# Patient Record
Sex: Female | Born: 1985 | Race: White | Hispanic: No | Marital: Married | Smoking: Current some day smoker
Health system: Southern US, Community
[De-identification: ages and names within clinical notes are randomized; demographics above are authoritative.]

## PROBLEM LIST (undated history)

## (undated) DIAGNOSIS — F909 Attention-deficit hyperactivity disorder, unspecified type: Secondary | ICD-10-CM

---

## 2005-08-17 ENCOUNTER — Emergency Department (HOSPITAL_COMMUNITY): Admission: EM | Admit: 2005-08-17 | Discharge: 2005-08-17 | Payer: Self-pay | Admitting: Emergency Medicine

## 2007-08-23 ENCOUNTER — Emergency Department (HOSPITAL_COMMUNITY): Admission: EM | Admit: 2007-08-23 | Discharge: 2007-08-23 | Payer: Self-pay | Admitting: Emergency Medicine

## 2011-01-23 LAB — URINE MICROSCOPIC-ADD ON

## 2011-01-23 LAB — CBC
HCT: 42.3
Hemoglobin: 14.5
MCHC: 34.4
MCV: 87.6
Platelets: 187
RBC: 4.83
RDW: 12.8
WBC: 10.5

## 2011-01-23 LAB — COMPREHENSIVE METABOLIC PANEL
Albumin: 4
BUN: 7
Creatinine, Ser: 0.88
Total Bilirubin: 0.6
Total Protein: 7.4

## 2011-01-23 LAB — DIFFERENTIAL
Basophils Absolute: 0
Lymphocytes Relative: 9 — ABNORMAL LOW
Monocytes Absolute: 0.4
Monocytes Relative: 4
Neutro Abs: 9.1 — ABNORMAL HIGH

## 2011-01-23 LAB — LIPASE, BLOOD: Lipase: 23

## 2011-01-23 LAB — URINALYSIS, ROUTINE W REFLEX MICROSCOPIC
Nitrite: NEGATIVE
Specific Gravity, Urine: 1.022
pH: 7.5

## 2019-03-05 ENCOUNTER — Other Ambulatory Visit: Payer: Self-pay

## 2019-03-05 DIAGNOSIS — Z20822 Contact with and (suspected) exposure to covid-19: Secondary | ICD-10-CM

## 2019-03-06 LAB — NOVEL CORONAVIRUS, NAA: SARS-CoV-2, NAA: NOT DETECTED

## 2019-06-19 ENCOUNTER — Observation Stay (HOSPITAL_COMMUNITY)
Admission: EM | Admit: 2019-06-19 | Discharge: 2019-06-20 | Disposition: A | Discharge status: Home / Self Care | Payer: No Typology Code available for payment source | Attending: Surgery | Admitting: Surgery

## 2019-06-19 ENCOUNTER — Emergency Department (HOSPITAL_COMMUNITY): Payer: No Typology Code available for payment source

## 2019-06-19 ENCOUNTER — Encounter (HOSPITAL_COMMUNITY)
Admission: EM | Disposition: A | Discharge status: Home / Self Care | Payer: Self-pay | Source: Home / Self Care | Attending: Emergency Medicine

## 2019-06-19 ENCOUNTER — Emergency Department (HOSPITAL_COMMUNITY): Payer: No Typology Code available for payment source | Admitting: Anesthesiology

## 2019-06-19 ENCOUNTER — Other Ambulatory Visit: Payer: Self-pay

## 2019-06-19 ENCOUNTER — Encounter (HOSPITAL_COMMUNITY): Payer: Self-pay

## 2019-06-19 DIAGNOSIS — Z20822 Contact with and (suspected) exposure to covid-19: Secondary | ICD-10-CM | POA: Diagnosis not present

## 2019-06-19 DIAGNOSIS — F909 Attention-deficit hyperactivity disorder, unspecified type: Secondary | ICD-10-CM | POA: Insufficient documentation

## 2019-06-19 DIAGNOSIS — Z79899 Other long term (current) drug therapy: Secondary | ICD-10-CM | POA: Diagnosis not present

## 2019-06-19 DIAGNOSIS — F17210 Nicotine dependence, cigarettes, uncomplicated: Secondary | ICD-10-CM | POA: Diagnosis not present

## 2019-06-19 DIAGNOSIS — K3580 Unspecified acute appendicitis: Secondary | ICD-10-CM | POA: Diagnosis not present

## 2019-06-19 HISTORY — DX: Attention-deficit hyperactivity disorder, unspecified type: F90.9

## 2019-06-19 HISTORY — PX: LAPAROSCOPIC APPENDECTOMY: SHX408

## 2019-06-19 LAB — CBC
HCT: 44 % (ref 36.0–46.0)
Hemoglobin: 15 g/dL (ref 12.0–15.0)
MCH: 30.9 pg (ref 26.0–34.0)
MCHC: 34.1 g/dL (ref 30.0–36.0)
MCV: 90.5 fL (ref 80.0–100.0)
Platelets: 200 10*3/uL (ref 150–400)
RBC: 4.86 MIL/uL (ref 3.87–5.11)
RDW: 12.6 % (ref 11.5–15.5)
WBC: 11.3 10*3/uL — ABNORMAL HIGH (ref 4.0–10.5)
nRBC: 0 % (ref 0.0–0.2)

## 2019-06-19 LAB — I-STAT BETA HCG BLOOD, ED (MC, WL, AP ONLY): I-stat hCG, quantitative: <5 m[IU]/mL (ref <5)

## 2019-06-19 LAB — COMPREHENSIVE METABOLIC PANEL
ALT: 18 U/L (ref 0–44)
AST: 20 U/L (ref 15–41)
Albumin: 4.5 g/dL (ref 3.5–5.0)
Alkaline Phosphatase: 50 U/L (ref 38–126)
Anion gap: 9 (ref 5–15)
BUN: 8 mg/dL (ref 6–20)
CO2: 26 mmol/L (ref 22–32)
Calcium: 9.5 mg/dL (ref 8.9–10.3)
Chloride: 103 mmol/L (ref 98–111)
Creatinine, Ser: 0.83 mg/dL (ref 0.44–1.00)
GFR calc Af Amer: >60 mL/min (ref >60)
GFR calc non Af Amer: >60 mL/min (ref >60)
Glucose, Bld: 102 mg/dL — ABNORMAL HIGH (ref 70–99)
Potassium: 3.6 mmol/L (ref 3.5–5.1)
Sodium: 138 mmol/L (ref 135–145)
Total Bilirubin: 1 mg/dL (ref 0.3–1.2)
Total Protein: 7.9 g/dL (ref 6.5–8.1)

## 2019-06-19 LAB — URINALYSIS, ROUTINE W REFLEX MICROSCOPIC
Bilirubin Urine: NEGATIVE
Glucose, UA: NEGATIVE mg/dL
Hgb urine dipstick: NEGATIVE
Ketones, ur: NEGATIVE mg/dL
Leukocytes,Ua: NEGATIVE
Nitrite: NEGATIVE
Protein, ur: NEGATIVE mg/dL
Specific Gravity, Urine: 1.017 (ref 1.005–1.030)
pH: 8 (ref 5.0–8.0)

## 2019-06-19 LAB — RESPIRATORY PANEL BY RT PCR (FLU A&B, COVID)
Influenza A by PCR: NEGATIVE
Influenza B by PCR: NEGATIVE
SARS Coronavirus 2 by RT PCR: NEGATIVE

## 2019-06-19 LAB — LIPASE, BLOOD: Lipase: 23 U/L (ref 11–51)

## 2019-06-19 SURGERY — APPENDECTOMY, LAPAROSCOPIC
Anesthesia: General | Site: Abdomen

## 2019-06-19 MED ORDER — METHOCARBAMOL 1000 MG/10ML IJ SOLN
500.0000 mg | Freq: Three times a day (TID) | INTRAVENOUS | Status: DC | PRN
  Filled 2019-06-19: qty 5

## 2019-06-19 MED ORDER — LACTATED RINGERS IV SOLN
INTRAVENOUS | Status: AC | PRN
  Administered 2019-06-19: 1000 mL via INTRAVENOUS

## 2019-06-19 MED ORDER — ENOXAPARIN SODIUM 40 MG/0.4ML ~~LOC~~ SOLN
40.0000 mg | SUBCUTANEOUS | Status: DC
  Administered 2019-06-20: 40 mg via SUBCUTANEOUS
  Filled 2019-06-19: qty 0.4

## 2019-06-19 MED ORDER — HYDROMORPHONE HCL 1 MG/ML IJ SOLN
0.2500 mg | INTRAMUSCULAR | Status: DC | PRN
  Administered 2019-06-19: 0.25 mg via INTRAVENOUS

## 2019-06-19 MED ORDER — SODIUM CHLORIDE 0.9 % IV SOLN: INTRAVENOUS | Status: DC

## 2019-06-19 MED ORDER — HYDROMORPHONE HCL 1 MG/ML IJ SOLN: 0.5000 mg | INTRAMUSCULAR | Status: DC | PRN

## 2019-06-19 MED ORDER — ROCURONIUM BROMIDE 10 MG/ML (PF) SYRINGE
PREFILLED_SYRINGE | INTRAVENOUS | Status: DC | PRN
  Administered 2019-06-19: 10 mg via INTRAVENOUS
  Administered 2019-06-19: 30 mg via INTRAVENOUS

## 2019-06-19 MED ORDER — HYDROMORPHONE HCL 1 MG/ML IJ SOLN
INTRAMUSCULAR | Status: AC
  Filled 2019-06-19: qty 1

## 2019-06-19 MED ORDER — BUPIVACAINE-EPINEPHRINE 0.5% -1:200000 IJ SOLN
INTRAMUSCULAR | Status: DC | PRN
  Administered 2019-06-19: 35 mL

## 2019-06-19 MED ORDER — DIPHENHYDRAMINE HCL 25 MG PO CAPS: 25.0000 mg | ORAL_CAPSULE | Freq: Four times a day (QID) | ORAL | Status: DC | PRN

## 2019-06-19 MED ORDER — LIDOCAINE 2% (20 MG/ML) 5 ML SYRINGE
INTRAMUSCULAR | Status: AC
  Filled 2019-06-19: qty 5

## 2019-06-19 MED ORDER — PIPERACILLIN-TAZOBACTAM 3.375 G IVPB 30 MIN
3.3750 g | Freq: Once | INTRAVENOUS | Status: AC
  Administered 2019-06-19: 13:00:00 3.375 g via INTRAVENOUS
  Filled 2019-06-19: qty 50

## 2019-06-19 MED ORDER — ACETAMINOPHEN 325 MG PO TABS: 650.0000 mg | ORAL_TABLET | Freq: Four times a day (QID) | ORAL | Status: DC | PRN

## 2019-06-19 MED ORDER — ACETAMINOPHEN 500 MG PO TABS
1000.0000 mg | ORAL_TABLET | ORAL | Status: AC
  Administered 2019-06-19: 1000 mg via ORAL
  Filled 2019-06-19: qty 2

## 2019-06-19 MED ORDER — SODIUM CHLORIDE 0.9% FLUSH: 3.0000 mL | Freq: Once | INTRAVENOUS | Status: DC

## 2019-06-19 MED ORDER — ONDANSETRON 4 MG PO TBDP
4.0000 mg | ORAL_TABLET | Freq: Four times a day (QID) | ORAL | Status: DC | PRN
  Filled 2019-06-19: qty 1

## 2019-06-19 MED ORDER — PHENYLEPHRINE 40 MCG/ML (10ML) SYRINGE FOR IV PUSH (FOR BLOOD PRESSURE SUPPORT)
PREFILLED_SYRINGE | INTRAVENOUS | Status: AC
  Filled 2019-06-19: qty 10

## 2019-06-19 MED ORDER — TRAMADOL HCL 50 MG PO TABS: 50.0000 mg | ORAL_TABLET | Freq: Four times a day (QID) | ORAL | Status: DC | PRN

## 2019-06-19 MED ORDER — MIDAZOLAM HCL 2 MG/2ML IJ SOLN
INTRAMUSCULAR | Status: AC
  Filled 2019-06-19: qty 2

## 2019-06-19 MED ORDER — DIPHENHYDRAMINE HCL 50 MG/ML IJ SOLN: 25.0000 mg | Freq: Four times a day (QID) | INTRAMUSCULAR | Status: DC | PRN

## 2019-06-19 MED ORDER — FENTANYL CITRATE (PF) 100 MCG/2ML IJ SOLN
INTRAMUSCULAR | Status: DC | PRN
  Administered 2019-06-19: 50 ug via INTRAVENOUS
  Administered 2019-06-19 (×2): 100 ug via INTRAVENOUS

## 2019-06-19 MED ORDER — IOHEXOL 300 MG/ML  SOLN
100.0000 mL | Freq: Once | INTRAMUSCULAR | Status: AC | PRN
  Administered 2019-06-19: 100 mL via INTRAVENOUS

## 2019-06-19 MED ORDER — KCL IN DEXTROSE-NACL 20-5-0.45 MEQ/L-%-% IV SOLN: INTRAVENOUS | Status: DC

## 2019-06-19 MED ORDER — OXYCODONE HCL 5 MG/5ML PO SOLN: 5.0000 mg | Freq: Once | ORAL | Status: DC | PRN

## 2019-06-19 MED ORDER — OXYCODONE HCL 5 MG PO TABS: 5.0000 mg | ORAL_TABLET | Freq: Once | ORAL | Status: DC | PRN

## 2019-06-19 MED ORDER — BUPIVACAINE-EPINEPHRINE 0.5% -1:200000 IJ SOLN
INTRAMUSCULAR | Status: AC
  Filled 2019-06-19: qty 1

## 2019-06-19 MED ORDER — PROMETHAZINE HCL 25 MG/ML IJ SOLN: 6.2500 mg | INTRAMUSCULAR | Status: DC | PRN

## 2019-06-19 MED ORDER — MORPHINE SULFATE (PF) 2 MG/ML IV SOLN: 1.0000 mg | INTRAVENOUS | Status: DC | PRN

## 2019-06-19 MED ORDER — PROPOFOL 10 MG/ML IV BOLUS
INTRAVENOUS | Status: AC
  Filled 2019-06-19: qty 20

## 2019-06-19 MED ORDER — LACTATED RINGERS IV SOLN: INTRAVENOUS | Status: DC

## 2019-06-19 MED ORDER — ONDANSETRON HCL 4 MG/2ML IJ SOLN
INTRAMUSCULAR | Status: DC | PRN
  Administered 2019-06-19: 4 mg via INTRAVENOUS

## 2019-06-19 MED ORDER — SCOPOLAMINE 1 MG/3DAYS TD PT72
MEDICATED_PATCH | TRANSDERMAL | Status: AC
  Filled 2019-06-19: qty 1

## 2019-06-19 MED ORDER — CHLORHEXIDINE GLUCONATE CLOTH 2 % EX PADS: 6.0000 | MEDICATED_PAD | Freq: Once | CUTANEOUS | Status: DC

## 2019-06-19 MED ORDER — LIDOCAINE 2% (20 MG/ML) 5 ML SYRINGE
INTRAMUSCULAR | Status: DC | PRN
  Administered 2019-06-19: 80 mg via INTRAVENOUS

## 2019-06-19 MED ORDER — ONDANSETRON HCL 4 MG/2ML IJ SOLN: 4.0000 mg | Freq: Four times a day (QID) | INTRAMUSCULAR | Status: DC | PRN

## 2019-06-19 MED ORDER — ACETAMINOPHEN 500 MG PO TABS
1000.0000 mg | ORAL_TABLET | Freq: Four times a day (QID) | ORAL | Status: DC
  Administered 2019-06-19 – 2019-06-20 (×2): 1000 mg via ORAL
  Filled 2019-06-19 (×2): qty 2

## 2019-06-19 MED ORDER — 0.9 % SODIUM CHLORIDE (POUR BTL) OPTIME
TOPICAL | Status: DC | PRN
  Administered 2019-06-19: 1000 mL

## 2019-06-19 MED ORDER — DEXAMETHASONE SODIUM PHOSPHATE 10 MG/ML IJ SOLN
INTRAMUSCULAR | Status: AC
  Filled 2019-06-19: qty 1

## 2019-06-19 MED ORDER — PROMETHAZINE HCL 25 MG/ML IJ SOLN
INTRAMUSCULAR | Status: AC
  Filled 2019-06-19: qty 1

## 2019-06-19 MED ORDER — PROPOFOL 10 MG/ML IV BOLUS
INTRAVENOUS | Status: DC | PRN
  Administered 2019-06-19: 200 mg via INTRAVENOUS

## 2019-06-19 MED ORDER — MIDAZOLAM HCL 5 MG/5ML IJ SOLN
INTRAMUSCULAR | Status: DC | PRN
  Administered 2019-06-19: 2 mg via INTRAVENOUS

## 2019-06-19 MED ORDER — GABAPENTIN 300 MG PO CAPS
300.0000 mg | ORAL_CAPSULE | ORAL | Status: AC
  Administered 2019-06-19: 300 mg via ORAL
  Filled 2019-06-19: qty 1

## 2019-06-19 MED ORDER — SCOPOLAMINE 1 MG/3DAYS TD PT72
MEDICATED_PATCH | TRANSDERMAL | Status: DC | PRN
  Administered 2019-06-19: 1 via TRANSDERMAL

## 2019-06-19 MED ORDER — DEXAMETHASONE SODIUM PHOSPHATE 10 MG/ML IJ SOLN
INTRAMUSCULAR | Status: DC | PRN
  Administered 2019-06-19: 10 mg via INTRAVENOUS

## 2019-06-19 MED ORDER — PHENYLEPHRINE 40 MCG/ML (10ML) SYRINGE FOR IV PUSH (FOR BLOOD PRESSURE SUPPORT)
PREFILLED_SYRINGE | INTRAVENOUS | Status: DC | PRN
  Administered 2019-06-19 (×2): 80 ug via INTRAVENOUS

## 2019-06-19 MED ORDER — LISDEXAMFETAMINE DIMESYLATE 70 MG PO CAPS: 70.0000 mg | ORAL_CAPSULE | Freq: Every morning | ORAL | Status: DC

## 2019-06-19 MED ORDER — ONDANSETRON HCL 4 MG/2ML IJ SOLN
INTRAMUSCULAR | Status: AC
  Filled 2019-06-19: qty 2

## 2019-06-19 MED ORDER — IBUPROFEN 400 MG PO TABS: 800.0000 mg | ORAL_TABLET | Freq: Three times a day (TID) | ORAL | Status: DC | PRN

## 2019-06-19 MED ORDER — SODIUM CHLORIDE (PF) 0.9 % IJ SOLN
INTRAMUSCULAR | Status: AC
  Filled 2019-06-19: qty 50

## 2019-06-19 MED ORDER — CELECOXIB 200 MG PO CAPS
200.0000 mg | ORAL_CAPSULE | ORAL | Status: AC
  Administered 2019-06-19: 200 mg via ORAL
  Filled 2019-06-19 (×2): qty 1

## 2019-06-19 MED ORDER — ROCURONIUM BROMIDE 10 MG/ML (PF) SYRINGE
PREFILLED_SYRINGE | INTRAVENOUS | Status: AC
  Filled 2019-06-19: qty 10

## 2019-06-19 MED ORDER — SUGAMMADEX SODIUM 200 MG/2ML IV SOLN
INTRAVENOUS | Status: DC | PRN
  Administered 2019-06-19: 150 mg via INTRAVENOUS

## 2019-06-19 MED ORDER — FENTANYL CITRATE (PF) 250 MCG/5ML IJ SOLN
INTRAMUSCULAR | Status: AC
  Filled 2019-06-19: qty 5

## 2019-06-19 SURGICAL SUPPLY — 41 items
APPLIER CLIP 5 13 M/L LIGAMAX5 (MISCELLANEOUS)
APPLIER CLIP ROT 10 11.4 M/L (STAPLE)
CABLE HIGH FREQUENCY MONO STRZ (ELECTRODE) ×3 IMPLANT
CHLORAPREP W/TINT 26 (MISCELLANEOUS) ×3 IMPLANT
CLIP APPLIE 5 13 M/L LIGAMAX5 (MISCELLANEOUS) IMPLANT
CLIP APPLIE ROT 10 11.4 M/L (STAPLE) IMPLANT
COVER WAND RF STERILE (DRAPES) IMPLANT
DECANTER SPIKE VIAL GLASS SM (MISCELLANEOUS) ×3 IMPLANT
DERMABOND ADVANCED (GAUZE/BANDAGES/DRESSINGS) ×2
DERMABOND ADVANCED .7 DNX12 (GAUZE/BANDAGES/DRESSINGS) ×1 IMPLANT
DRAPE LAPAROSCOPIC ABDOMINAL (DRAPES) IMPLANT
ELECT REM PT RETURN 15FT ADLT (MISCELLANEOUS) ×3 IMPLANT
GLOVE BIO SURGEON STRL SZ 6.5 (GLOVE) ×2 IMPLANT
GLOVE BIO SURGEONS STRL SZ 6.5 (GLOVE) ×1
GLOVE BIOGEL PI IND STRL 7.0 (GLOVE) ×1 IMPLANT
GLOVE BIOGEL PI INDICATOR 7.0 (GLOVE) ×2
GOWN STRL REUS W/TWL XL LVL3 (GOWN DISPOSABLE) ×6 IMPLANT
GRASPER SUT TROCAR 14GX15 (MISCELLANEOUS) IMPLANT
HANDLE STAPLE EGIA 4 XL (STAPLE) ×3 IMPLANT
IRRIG SUCT STRYKERFLOW 2 WTIP (MISCELLANEOUS) ×3
IRRIGATION SUCT STRKRFLW 2 WTP (MISCELLANEOUS) ×1 IMPLANT
KIT BASIN OR (CUSTOM PROCEDURE TRAY) ×3 IMPLANT
KIT TURNOVER KIT A (KITS) IMPLANT
MARKER SKIN DUAL TIP RULER LAB (MISCELLANEOUS) IMPLANT
PENCIL SMOKE EVACUATOR (MISCELLANEOUS) IMPLANT
POUCH SPECIMEN RETRIEVAL 10MM (ENDOMECHANICALS) ×3 IMPLANT
RELOAD EGIA 45 MED/THCK PURPLE (STAPLE) ×3 IMPLANT
RELOAD EGIA 45 TAN VASC (STAPLE) ×3 IMPLANT
RELOAD EGIA 60 MED/THCK PURPLE (STAPLE) IMPLANT
RELOAD EGIA 60 TAN VASC (STAPLE) IMPLANT
SCISSORS LAP 5X35 DISP (ENDOMECHANICALS) IMPLANT
SLEEVE XCEL OPT CAN 5 100 (ENDOMECHANICALS) IMPLANT
SUT VIC AB 2-0 SH 27 (SUTURE)
SUT VIC AB 2-0 SH 27X BRD (SUTURE) IMPLANT
SUT VIC AB 4-0 PS2 27 (SUTURE) ×3 IMPLANT
SUT VICRYL 0 UR6 27IN ABS (SUTURE) IMPLANT
TOWEL OR 17X26 10 PK STRL BLUE (TOWEL DISPOSABLE) ×3 IMPLANT
TRAY FOLEY MTR SLVR 16FR STAT (SET/KITS/TRAYS/PACK) ×3 IMPLANT
TRAY LAPAROSCOPIC (CUSTOM PROCEDURE TRAY) ×3 IMPLANT
TROCAR BLADELESS OPT 5 100 (ENDOMECHANICALS) IMPLANT
TROCAR XCEL BLUNT TIP 100MML (ENDOMECHANICALS) ×3 IMPLANT

## 2019-06-19 NOTE — ED Triage Notes (Signed)
Patient c/o RLQ pain since yesterday. Patient went to PCP today for the same and was sent to the ED to r/o appendicitis.

## 2019-06-19 NOTE — Plan of Care (Signed)

## 2019-06-19 NOTE — Anesthesia Procedure Notes (Signed)
Procedure Name: Intubation Date/Time: 06/19/2019 4:09 PM Performed by: Conlin Brahm D, CRNA Pre-anesthesia Checklist: Patient identified, Emergency Drugs available, Suction available and Patient being monitored Patient Re-evaluated:Patient Re-evaluated prior to induction Oxygen Delivery Method: Circle system utilized Preoxygenation: Pre-oxygenation with 100% oxygen Induction Type: IV induction Ventilation: Mask ventilation without difficulty Laryngoscope Size: Mac and 3 Grade View: Grade I Tube type: Oral Tube size: 7.0 mm Number of attempts: 1 Airway Equipment and Method: Stylet Placement Confirmation: ETT inserted through vocal cords under direct vision,  positive ETCO2 and breath sounds checked- equal and bilateral Secured at: 21 cm Tube secured with: Tape Dental Injury: Teeth and Oropharynx as per pre-operative assessment

## 2019-06-19 NOTE — Discharge Instructions (Signed)
[facility] ° °Please arrive at least 30 min before your appointment to complete your check in paperwork.  If you are unable to arrive 30 min prior to your appointment time we may have to cancel or reschedule you. °LAPAROSCOPIC SURGERY: POST OP INSTRUCTIONS °Always review your discharge instruction sheet given to you by the facility where your surgery was performed. °IF YOU HAVE DISABILITY OR FAMILY LEAVE FORMS, YOU MUST BRING THEM TO THE OFFICE FOR PROCESSING.   °DO NOT GIVE THEM TO YOUR DOCTOR. ° °PAIN CONTROL ° °1. First take acetaminophen (Tylenol) AND/or ibuprofen (Advil) to control your pain after surgery.  Follow directions on package.  Taking acetaminophen (Tylenol) and/or ibuprofen (Advil) regularly after surgery will help to control your pain and lower the amount of prescription pain medication you may need.  You should not take more than 4,000 mg (4 grams) of acetaminophen (Tylenol) in 24 hours.  You should not take ibuprofen (Advil), aleve, motrin, naprosyn or other NSAIDS if you have a history of stomach ulcers or chronic kidney disease.  °2. A prescription for pain medication may be given to you upon discharge.  Take your pain medication as prescribed, if you still have uncontrolled pain after taking acetaminophen (Tylenol) or ibuprofen (Advil). °3. Use ice packs to help control pain. °4. If you need a refill on your pain medication, please contact your pharmacy.  They will contact our office to request authorization. Prescriptions will not be filled after 5pm or on week-ends. ° °HOME MEDICATIONS °5. Take your usually prescribed medications unless otherwise directed. ° °DIET °6. You should follow a light diet the first few days after arrival home.  Be sure to include lots of fluids daily. Avoid fatty, fried foods.  ° °CONSTIPATION °7. It is common to experience some constipation after surgery and if you are taking pain medication.  Increasing fluid intake and taking a stool  softener (such as Colace) will usually help or prevent this problem from occurring.  A mild laxative (Milk of Magnesia or Miralax) should be taken according to package instructions if there are no bowel movements after 48 hours. ° °WOUND/INCISION CARE °8. Most patients will experience some swelling and bruising in the area of the incisions.  Ice packs will help.  Swelling and bruising can take several days to resolve.  °9. Unless discharge instructions indicate otherwise, follow guidelines below  °a. STERI-STRIPS - you may remove your outer bandages 48 hours after surgery, and you may shower at that time.  You have steri-strips (small skin tapes) in place directly over the incision.  These strips should be left on the skin for 7-10 days.   °b. DERMABOND/SKIN GLUE - you may shower in 24 hours.  The glue will flake off over the next 2-3 weeks. °10. Any sutures or staples will be removed at the office during your follow-up visit. ° °ACTIVITIES °11. You may resume regular (light) daily activities beginning the next day--such as daily self-care, walking, climbing stairs--gradually increasing activities as tolerated.  You may have sexual intercourse when it is comfortable.  Refrain from any heavy lifting or straining until approved by your doctor. °a. You may drive when you are no longer taking prescription pain medication, you can comfortably wear a seatbelt, and you can safely maneuver your car and apply brakes. ° °FOLLOW-UP °12. You should see your doctor in the office for a follow-up appointment approximately 2-3 weeks after your surgery.  You should have been given your post-op/follow-up appointment when   your surgery was scheduled.  If you did not receive a post-op/follow-up appointment, make sure that you call for this appointment within a day or two after you arrive home to insure a convenient appointment time. ° °OTHER INSTRUCTIONS ° °WHEN TO CALL YOUR DOCTOR: °1. Fever over 101.0 °2. Inability to  urinate °3. Continued bleeding from incision. °4. Increased pain, redness, or drainage from the incision. °5. Increasing abdominal pain ° °The clinic staff is available to answer your questions during regular business hours.  Please don’t hesitate to call and ask to speak to one of the nurses for clinical concerns.  If you have a medical emergency, go to the nearest emergency room or call 911.  A surgeon from [facility] is always on call at the hospital. °[address], [address], [place], [place]  [address] ? [address] [address], [place], [place]   [address] °[phone] ? [phone] ? FAX [phone] ° ° ° °

## 2019-06-19 NOTE — Anesthesia Preprocedure Evaluation (Addendum)
Anesthesia Evaluation  Patient identified by MRN, date of birth, ID band Patient awake    Reviewed: Allergy & Precautions, NPO status , Patient's Chart, lab work & pertinent test results  Airway Mallampati: II  TM Distance: >3 FB Neck ROM: Full    Dental no notable dental hx.    Pulmonary Current Smoker and Patient abstained from smoking.,    Pulmonary exam normal breath sounds clear to auscultation       Cardiovascular negative cardio ROS Normal cardiovascular exam Rhythm:Regular Rate:Normal     Neuro/Psych PSYCHIATRIC DISORDERS ADHDnegative neurological ROS     GI/Hepatic negative GI ROS, Neg liver ROS,   Endo/Other  negative endocrine ROS  Renal/GU negative Renal ROS     Musculoskeletal negative musculoskeletal ROS (+)   Abdominal   Peds  Hematology negative hematology ROS (+)   Anesthesia Other Findings Acute appendicitis  Reproductive/Obstetrics hcg negative                            Anesthesia Physical Anesthesia Plan  ASA: II and emergent  Anesthesia Plan: General   Post-op Pain Management:    Induction: Intravenous  PONV Risk Score and Plan: 2 and Ondansetron, Dexamethasone, Midazolam and Treatment may vary due to age or medical condition  Airway Management Planned: Oral ETT  Additional Equipment:   Intra-op Plan:   Post-operative Plan: Extubation in OR  Informed Consent: I have reviewed the patients History and Physical, chart, labs and discussed the procedure including the risks, benefits and alternatives for the proposed anesthesia with the patient or authorized representative who has indicated his/her understanding and acceptance.     Dental advisory given  Plan Discussed with: CRNA  Anesthesia Plan Comments:        Anesthesia Quick Evaluation

## 2019-06-19 NOTE — Op Note (Signed)
Latiesha Harada [id number]   PRE-OPERATIVE DIAGNOSIS:  Acute appendicitis  POST-OPERATIVE DIAGNOSIS:  Acute appendicitis   Procedure(s): APPENDECTOMY LAPAROSCOPIC   Surgeon(s): Romie Levee, MD  ASSISTANT: none   ANESTHESIA:   local and general  EBL:   18ml  Delay start of Pharmacological VTE agent (>24hrs) due to surgical blood loss or risk of bleeding:  no  DRAINS: none   SPECIMEN:  Source of Specimen:  appendix  DISPOSITION OF SPECIMEN:  PATHOLOGY  COUNTS:  YES  PLAN OF CARE: Admit for overnight observation  PATIENT DISPOSITION:  PACU - hemodynamically stable.   INDICATIONS: Patient with concerning symptoms & work up suspicious for appendicitis.  Surgery was recommended:  The anatomy & physiology of the digestive tract was discussed.  The pathophysiology of appendicitis was discussed.  Natural history risks without surgery was discussed.   I feel the risks of no intervention will lead to serious problems that outweigh the operative risks; therefore, I recommended diagnostic laparoscopy with removal of appendix to remove the pathology.  Laparoscopic & open techniques were discussed.   I noted a good likelihood this will help address the problem.    Risks such as bleeding, infection, abscess, leak, reoperation, possible ostomy, hernia, heart attack, death, and other risks were discussed.  Goals of post-operative recovery were discussed as well.  We will work to minimize complications.  Questions were answered.  The patient expresses understanding & wishes to proceed with surgery.  OR FINDINGS: early appendicitis   DESCRIPTION:   The patient was identified & brought into the operating room. The patient was positioned supine with left arm tucked. SCDs were active during the entire case. The patient underwent general anesthesia without any difficulty.  A foley catheter was inserted under sterile conditions. The abdomen was prepped and draped in a sterile fashion. A Surgical  Timeout confirmed our plan.   I made a transverse incision through the inferior umbilical fold.  I made a nick in the infraumbilical fascia and confirmed peritoneal entry.  I placed a stay suture and then the [facility] port.  We induced carbon dioxide insufflation.  Camera inspection revealed no injury.  I placed additional ports under direct laparoscopic visualization.  I mobilized the terminal ileum to proximal ascending colon in a lateral to medial fashion.  I took care to avoid injuring any retroperitoneal structures.   I freed the appendix off its attachments to the ascending colon and cecal mesentery.  I elevated the appendix.  I was able to free off the base of the appendix, which was still viable.  I stapled the appendix off the cecum using a laparoscopic bowel load stapler.  I took a healthy cuff viable cecum. I skeletonized & ligated the mesoappendix with a vascular load stapler.   I placed the appendix inside an EndoCatch bag and removed out the Greasy port.  I did copious irrigation. Hemostasis was good in the mesoappendix, colon mesentery, and retroperitoneum. Staple line was intact on the cecum with no bleeding. I washed out the pelvis, retrohepatic space and right paracolic gutter.  Hemostasis is good. There was no perforation or injury.  Because the area cleaned up well after irrigation, I did not place a drain.  I aspirated the carbon dioxide. I removed the ports. I closed the umbilical fascia site using a 0 Vicryl stitch. I closed skin using 4-0 vicryl stitch.  Sterile dressings were applied.  Patient was extubated and sent to the recovery room.  I discussed the operative findings with the patient's  family. I suspect the patient is going used in the hospital at least overnight and will need antibiotics for 0 days. Questions answered. They expressed understanding and appreciation.

## 2019-06-19 NOTE — Anesthesia Postprocedure Evaluation (Signed)
Anesthesia Post Note  Patient: Danielle Reynolds  Procedure(s) Performed: APPENDECTOMY LAPAROSCOPIC (N/A Abdomen)     Patient location during evaluation: PACU Anesthesia Type: General Level of consciousness: awake and alert Pain management: pain level controlled Vital Signs Assessment: post-procedure vital signs reviewed and stable Respiratory status: spontaneous breathing, nonlabored ventilation, respiratory function stable and patient connected to nasal cannula oxygen Cardiovascular status: blood pressure returned to baseline and stable Postop Assessment: no apparent nausea or vomiting Anesthetic complications: no    Last Vitals:  Vitals:   06/19/19 1715 06/19/19 1730  BP: 126/78 123/75  Pulse: 87 88  Resp: 17 18  Temp:  (!) 36.4 C  SpO2: 99% 98%    Last Pain:  Vitals:   06/19/19 1730  TempSrc:   PainSc: Asleep                 Darric Plante P Trinitee Horgan

## 2019-06-19 NOTE — ED Provider Notes (Signed)
[facility]-EMERGENCY DEPT Provider Note   CSN: [id number] Arrival date & time: 06/19/19  1036     History Chief Complaint  Patient presents with  . Appendicitis    Danielle Reynolds is a 34 y.o. female.  HPI   34 year old female with right lower quadrant pain.  Onset last night.  Persistent since then.  Pain is constant.  Sometimes worse with certain movements.  Nauseated.  No fevers or chills.  No urinary complaints.  No change in bowel movements.  No prior abdominal surgical history.  She does not think she is pregnant.  Past Medical History:  Diagnosis Date  . ADHD    There are no problems to display for this patient.  History reviewed. No pertinent surgical history.   OB History   No obstetric history on file.    Family History  Problem Relation Age of Onset  . Healthy Mother   . Hypertension Father    Social History   Tobacco Use  . Smoking status: Current Some Day Smoker    Types: Cigarettes  . Smokeless tobacco: Never Used  Substance Use Topics  . Alcohol use: Yes  . Drug use: Never   Home Medications Prior to Admission medications   Medication Sig Start Date End Date Taking? Authorizing Provider  VYVANSE 70 MG capsule Take 70 mg by mouth every morning. 06/01/19  Yes [provider]   Allergies    Patient has no known allergies.  Review of Systems   Review of Systems All systems reviewed and negative, other than as noted in HPI.  Physical Exam Updated Vital Signs BP (!) 122/92   Pulse (!) 101   Temp 97.6 F (36.4 C) (Oral)   Resp 15   Ht 5\' 9"  (1.753 m)   Wt 74.4 kg   LMP 06/05/2019 (Approximate) Comment: neg hcg  SpO2 100%   BMI 24.22 kg/m   Physical Exam Vitals and nursing note reviewed.  Constitutional:      General: She is not in acute distress.    Appearance: She is well-developed.  HENT:     Head: Normocephalic and atraumatic.  Eyes:     General:        Right eye: No discharge.        Left eye: No  discharge.     Conjunctiva/sclera: Conjunctivae normal.  Cardiovascular:     Rate and Rhythm: Normal rate and regular rhythm.     Heart sounds: Normal heart sounds. No murmur. No friction rub. No gallop.   Pulmonary:     Effort: Pulmonary effort is normal. No respiratory distress.     Breath sounds: Normal breath sounds.  Abdominal:     General: There is no distension.     Palpations: Abdomen is soft. There is no mass.     Tenderness: There is abdominal tenderness.     Hernia: No hernia is present.     Comments: Tenderness palpation in the right lower quadrant without rebound or guarding.  No distention.  Musculoskeletal:        General: No tenderness.     Cervical back: Neck supple.  Skin:    General: Skin is warm and dry.  Neurological:     Mental Status: She is alert.  Psychiatric:        Behavior: Behavior normal.        Thought Content: Thought content normal.    ED Results / Procedures / Treatments   Labs (all labs ordered are listed,  but only abnormal results are displayed) Labs Reviewed  COMPREHENSIVE METABOLIC PANEL - Abnormal; Notable for the following components:      Result Value   Glucose, Bld 102 (*)    All other components within normal limits  CBC - Abnormal; Notable for the following components:   WBC 11.3 (*)    All other components within normal limits  URINALYSIS, ROUTINE W REFLEX MICROSCOPIC - Abnormal; Notable for the following components:   Color, Urine STRAW (*)    All other components within normal limits  RESPIRATORY PANEL BY RT PCR (FLU A&B, COVID)  LIPASE, BLOOD  I-STAT BETA HCG BLOOD, ED ([facility], [facility], [facility] ONLY)   EKG None  Radiology CT ABDOMEN PELVIS W CONTRAST  Result Date: 06/19/2019 CLINICAL DATA:  Right lower quadrant pain since yesterday. Suspect appendicitis. EXAM: CT ABDOMEN AND PELVIS WITH CONTRAST TECHNIQUE: Multidetector CT imaging of the abdomen and pelvis was performed using the standard protocol following bolus administration of  intravenous contrast. CONTRAST:  118mL OMNIPAQUE IOHEXOL 300 MG/ML  SOLN COMPARISON:  None. FINDINGS: Lower chest: Lung bases are clear. Hepatobiliary: Liver, gallbladder and biliary tree are normal. Pancreas: Normal. Spleen: Normal. Adrenals/Urinary Tract: Adrenal glands are normal. Kidneys are normal size without hydronephrosis or nephrolithiasis. Ureters and bladder are normal. Stomach/Bowel: Stomach and small bowel are normal. Appendix is retrocecal in location and demonstrates mucosal enhancement and is mildly dilated measuring 9-10 mm. There is subtle adjacent stranding of the adjacent fat and tiny amount of adjacent free fluid over the pericolic gutter. No evidence of perforation or abscess. No significant free fluid. Findings likely due to early acute appendicitis. Colon is normal Vascular/Lymphatic: No significant vascular findings are present. No enlarged abdominal or pelvic lymph nodes. Reproductive: Uterus and bilateral adnexa are unremarkable. Other: None. Musculoskeletal: No acute or significant osseous findings. IMPRESSION: Findings compatible with early acute appendicitis. No evidence of perforation or abscess. These results were called by telephone at the time of interpretation on 06/19/2019 at 12:38 pm to provider [facility] , who verbally acknowledged these results. Electronically Signed   By: Marin Olp M.D.   On: 06/19/2019 12:38   Procedures Procedures (including critical care time)  Medications Ordered in ED Medications  sodium chloride flush (NS) 0.9 % injection 3 mL (3 mLs Intravenous Not Given 06/19/19 1102)  0.9 %  sodium chloride infusion ( Intravenous Paused 06/19/19 1256)  sodium chloride (PF) 0.9 % injection (has no administration in time range)  piperacillin-tazobactam (ZOSYN) IVPB 3.375 g (3.375 g Intravenous New Bag/Given 06/19/19 1255)  iohexol (OMNIPAQUE) 300 MG/ML solution 100 mL (100 mLs Intravenous Contrast Given 06/19/19 1209)    ED Course  I have reviewed the  triage vital signs and the nursing notes.  Pertinent labs & imaging results that were available during my care of the patient were reviewed by me and considered in my medical decision making (see chart for details).    MDM Rules/Calculators/A&P  34 year old female with right lower quadrant pain.  CT consistent with early appendicitis.  No perforation or abscess.  She has continued to decline pain medication.  Antibiotics.  Covid testing.  Surgical consultation.  Final Clinical Impression(s) / ED Diagnoses Final diagnoses:  Acute appendicitis, unspecified acute appendicitis type    Rx / DC Orders ED Discharge Orders    None       Virgel Manifold, MD 06/19/19 1320

## 2019-06-19 NOTE — ED Notes (Signed)
Patient in CT at this time.

## 2019-06-19 NOTE — Transfer of Care (Signed)
Immediate Anesthesia Transfer of Care Note  Patient: Danielle Reynolds  Procedure(s) Performed: APPENDECTOMY LAPAROSCOPIC (N/A Abdomen)  Patient Location: PACU  Anesthesia Type:General  Level of Consciousness: awake, alert  and oriented  Airway & Oxygen Therapy: Patient Spontanous Breathing and Patient connected to face mask oxygen  Post-op Assessment: Report given to RN and Post -op Vital signs reviewed and stable  Post vital signs: Reviewed and stable  Last Vitals:  Vitals Value Taken Time  BP 121/104 06/19/19 1658  Temp    Pulse 102 06/19/19 1659  Resp 15 06/19/19 1659  SpO2 100 % 06/19/19 1659  Vitals shown include unvalidated device data.  Last Pain:  Vitals:   06/19/19 1235  TempSrc:   PainSc: 8          Complications: No apparent anesthesia complications

## 2019-06-19 NOTE — H&P (Signed)
[facility] Admission Note  Danielle Reynolds 15-Aug-1985  [id number].    Requesting MD: Raeford Razor Chief Complaint/Reason for Consult: appendicitis  HPI:  Danielle Reynolds is a 34yo female who presented to [facility] today complaining of abdominal pain. States that the pain started around 1700 last night. Initially central/diffuse but now more localized to the RLQ. Pain is constant, severe, and worse with movement. Associated with chills and nausea. Denies fever or emesis. States that she has never had pain like this before. ED workup included CT scan which shows early acute appendicitis without evidence of perforation or abscess. WBC 11.3. General surgery asked to see.  PMH significant for ADHD Abdominal surgical history: none Anticoagulants: none Smokes occassionally Employment: scheduling at [employer]  Review of Systems  Constitutional: Positive for chills. Negative for fever.  HENT: Negative.   Eyes: Negative.   Respiratory: Negative.   Cardiovascular: Negative.   Gastrointestinal: Positive for abdominal pain and nausea. Negative for vomiting.  Genitourinary: Negative.   Musculoskeletal: Negative.   Skin: Negative.   Neurological: Negative.     All systems reviewed and otherwise negative except for as above  Family History  Problem Relation Age of Onset  . Healthy Mother   . Hypertension Father     Past Medical History:  Diagnosis Date  . ADHD     History reviewed. No pertinent surgical history.  Social History:  reports that she has been smoking cigarettes. She has never used smokeless tobacco. She reports current alcohol use. She reports that she does not use drugs.  Allergies: No Known Allergies  (Not in a hospital admission)   Prior to Admission medications   Medication Sig Start Date End Date Taking? Authorizing Provider  VYVANSE 70 MG capsule Take 70 mg by mouth every morning. 06/01/19  Yes [provider]    Blood pressure (!)  122/92, pulse (!) 101, temperature 97.6 F (36.4 C), temperature source Oral, resp. rate 15, height 5\' 9"  (1.753 m), weight 74.4 kg, last menstrual period 06/05/2019, SpO2 100 %. Physical Exam: General: pleasant, WD/WN white female who is laying in bed in NAD HEENT: head is normocephalic, atraumatic.  Sclera are noninjected.  PERRL.  Ears and nose without any masses or lesions.  Mouth is pink and moist. Dentition fair Heart: regular, rate, and rhythm.  Normal s1,s2. No obvious murmurs, gallops, or rubs noted.  Palpable pedal pulses bilaterally  Lungs: CTAB, no wheezes, rhonchi, or rales noted.  Respiratory effort nonlabored Abd: soft, ND, +BS, no masses, hernias, or organomegaly. TTP RLQ with voluntary guarding, no peritonitis MS: no BUE/BLE edema, calves soft and nontender Skin: warm and dry with no masses, lesions, or rashes Psych: A&Ox4 with an appropriate affect Neuro: cranial nerves grossly intact, equal strength in BUE/BLE bilaterally, normal speech, thought process intact  Results for orders placed or performed during the hospital encounter of 06/19/19 (from the past 48 hour(s))  Lipase, blood     Status: None   Collection Time: 06/19/19 10:51 AM  Result Value Ref Range   Lipase 23 11 - 51 U/L    Comment: Performed at [facility], [address], [place], [place]  Comprehensive metabolic panel     Status: Abnormal   Collection Time: 06/19/19 10:51 AM  Result Value Ref Range   Sodium 138 135 - 145 mmol/L   Potassium 3.6 3.5 - 5.1 mmol/L   Chloride 103 98 - 111 mmol/L   CO2 26 22 - 32 mmol/L  Glucose, Bld 102 (H) 70 - 99 mg/dL   BUN 8 6 - 20 mg/dL   Creatinine, Ser 3.61 0.44 - 1.00 mg/dL   Calcium 9.5 8.9 - 22.4 mg/dL   Total Protein 7.9 6.5 - 8.1 g/dL   Albumin 4.5 3.5 - 5.0 g/dL   AST 20 15 - 41 U/L   ALT 18 0 - 44 U/L   Alkaline Phosphatase 50 38 - 126 U/L   Total Bilirubin 1.0 0.3 - 1.2 mg/dL   GFR calc non Af Amer >60 >60 mL/min   GFR calc  Af Amer >60 >60 mL/min   Anion gap 9 5 - 15    Comment: Performed at [facility], [address], [place], [place] [address]  CBC     Status: Abnormal   Collection Time: 06/19/19 10:51 AM  Result Value Ref Range   WBC 11.3 (H) 4.0 - 10.5 K/uL   RBC 4.86 3.87 - 5.11 MIL/uL   Hemoglobin 15.0 12.0 - 15.0 g/dL   HCT 00.5 11.0 - 21.1 %   MCV 90.5 80.0 - 100.0 fL   MCH 30.9 26.0 - 34.0 pg   MCHC 34.1 30.0 - 36.0 g/dL   RDW 17.3 56.7 - 01.4 %   Platelets 200 150 - 400 K/uL   nRBC 0.0 0.0 - 0.2 %    Comment: Performed at [facility], [address] [place], [place] [address]  Urinalysis, Routine w reflex microscopic     Status: Abnormal   Collection Time: 06/19/19 10:51 AM  Result Value Ref Range   Color, Urine STRAW (A) YELLOW   APPearance CLEAR CLEAR   Specific Gravity, Urine 1.017 1.005 - 1.030   pH 8.0 5.0 - 8.0   Glucose, UA NEGATIVE NEGATIVE mg/dL   Hgb urine dipstick NEGATIVE NEGATIVE   Bilirubin Urine NEGATIVE NEGATIVE   Ketones, ur NEGATIVE NEGATIVE mg/dL   Protein, ur NEGATIVE NEGATIVE mg/dL   Nitrite NEGATIVE NEGATIVE   Leukocytes,Ua NEGATIVE NEGATIVE    Comment: Performed at [facility], [address], [place], [place] [address]  I-Stat beta hCG blood, ED     Status: None   Collection Time: 06/19/19 11:18 AM  Result Value Ref Range   I-stat hCG, quantitative <5.0 <5 mIU/mL   Comment 3            Comment:   GEST. AGE      CONC.  (mIU/mL)   <=1 WEEK        5 - 50     2 WEEKS       50 - 500     3 WEEKS       100 - 10,000     4 WEEKS     1,000 - 30,000        FEMALE AND NON-PREGNANT FEMALE:     LESS THAN 5 mIU/mL    CT ABDOMEN PELVIS W CONTRAST  Result Date: 06/19/2019 CLINICAL DATA:  Right lower quadrant pain since yesterday. Suspect appendicitis. EXAM: CT ABDOMEN AND PELVIS WITH CONTRAST TECHNIQUE: Multidetector CT imaging of the abdomen and pelvis was performed using the standard protocol following bolus  administration of intravenous contrast. CONTRAST:  OMNIPAQUE IOHEXOL 300 MG/ML  SOLN COMPARISON:  None. FINDINGS: Lower chest: Lung bases are clear. Hepatobiliary: Liver, gallbladder and biliary tree are normal. Pancreas: Normal. Spleen: Normal. Adrenals/Urinary Tract: Adrenal glands are normal. Kidneys are normal size without hydronephrosis or nephrolithiasis. Ureters and bladder are normal. Stomach/Bowel: Stomach  and small bowel are normal. Appendix is retrocecal in location and demonstrates mucosal enhancement and is mildly dilated measuring 9-10 mm. There is subtle adjacent stranding of the adjacent fat and tiny amount of adjacent free fluid over the pericolic gutter. No evidence of perforation or abscess. No significant free fluid. Findings likely due to early acute appendicitis. Colon is normal Vascular/Lymphatic: No significant vascular findings are present. No enlarged abdominal or pelvic lymph nodes. Reproductive: Uterus and bilateral adnexa are unremarkable. Other: None. Musculoskeletal: No acute or significant osseous findings. IMPRESSION: Findings compatible with early acute appendicitis. No evidence of perforation or abscess. These results were called by telephone at the time of interpretation on 06/19/2019 at 12:38 pm to provider [facility] , who verbally acknowledged these results. Electronically Signed   By: Marin Olp M.D.   On: 06/19/2019 12:38      Assessment/Plan ADHD  Acute appendicitis - Patient with clinic and radiographic findings consistent with acute appendicitis. Will plan for laparoscopic appendectomy today, pending COVID test. Keep NPO. She is currently receiving IV zosyn.  She may be able to be discharged home from PACU.  Wellington Hampshire, [facility] 06/19/2019, 1:40 PM Please see Amion for pager number during day hours 7:00am-4:30pm

## 2019-06-20 MED ORDER — TRAMADOL HCL 50 MG PO TABS: 50.0000 mg | ORAL_TABLET | Freq: Four times a day (QID) | ORAL | 0 refills | Status: DC | PRN

## 2019-06-20 MED ORDER — SODIUM CHLORIDE 0.9 % IV SOLN
INTRAVENOUS | Status: DC | PRN
  Administered 2019-06-20: 1000 mL via INTRAVENOUS

## 2019-06-20 NOTE — Progress Notes (Signed)
Patient ID: Danielle Reynolds, female   DOB: 1985-11-30, 34 y.o.   MRN: [id number]   POD#1  Doing well Pain controlled Abdomen soft Tolerating po  Plan: Discharge home

## 2019-06-20 NOTE — Discharge Summary (Signed)
Physician Discharge Summary  Patient ID: Danielle Reynolds MRN: [id number] DOB/AGE: 35-Jun-1987 35 y.o.  Admit date: 06/19/2019 Discharge date: 06/20/2019  Admission Diagnoses:  Discharge Diagnoses:  Active Problems:   Acute appendicitis   Discharged Condition: good  Hospital Course: uneventful hospital course.  Discharged home POD#1  Consults: None  Significant Diagnostic Studies:   Treatments: surgery: laparoscopic appendectomy  Discharge Exam: Blood pressure 122/83, pulse 88, temperature 98.2 F (36.8 C), temperature source Oral, resp. rate 16, height 5\' 9"  (1.753 m), weight 74.4 kg, last menstrual period 06/05/2019, SpO2 100 %. General appearance: alert, cooperative and no distress Resp: clear to auscultation bilaterally Incision/Wound:abdomen soft, non-distended, incisions clean  Disposition: Discharge disposition: 01-Home or Self Care        Allergies as of 06/20/2019   No Known Allergies     Medication List    TAKE these medications   traMADol 50 MG tablet Commonly known as: Ultram Take 1 tablet (50 mg total) by mouth every 6 (six) hours as needed for moderate pain.   Vyvanse 70 MG capsule Generic drug: lisdexamfetamine Take 70 mg by mouth every morning.      Follow-up Information    [facility], [facility]. Go on 07/09/2019.   Specialty: General Surgery Why: Your appointment is 03/11 at 1:30 pm A provider will call you at the time of your appointment. Please send photo of your incisions to [email or web address] Contact information: [address] [place] [phone]          Signed: [phone] 06/20/2019, 8:10 AM

## 2019-06-20 NOTE — Progress Notes (Signed)
During shift, pt dangled on side of bed, ambulated to bathroom, and ambulated in hall twice (360 feet both times). Have not needed to give pt any nausea medication during shift. No c/o of nausea from pt. Just advanced diet from clear to regular.

## 2019-06-20 NOTE — Progress Notes (Signed)
Pt ambulated in the hall again. This time, walked up and down the hall twice. (720 ft).

## 2019-06-22 LAB — SURGICAL PATHOLOGY

## 2020-08-18 ENCOUNTER — Emergency Department (HOSPITAL_BASED_OUTPATIENT_CLINIC_OR_DEPARTMENT_OTHER): Payer: BC Managed Care – PPO

## 2020-08-18 ENCOUNTER — Encounter (HOSPITAL_BASED_OUTPATIENT_CLINIC_OR_DEPARTMENT_OTHER): Payer: Self-pay

## 2020-08-18 ENCOUNTER — Emergency Department (HOSPITAL_BASED_OUTPATIENT_CLINIC_OR_DEPARTMENT_OTHER)
Admission: EM | Admit: 2020-08-18 | Discharge: 2020-08-18 | Disposition: A | Discharge status: Home / Self Care | Payer: BC Managed Care – PPO | Attending: Emergency Medicine | Admitting: Emergency Medicine

## 2020-08-18 DIAGNOSIS — F17210 Nicotine dependence, cigarettes, uncomplicated: Secondary | ICD-10-CM | POA: Diagnosis not present

## 2020-08-18 DIAGNOSIS — R000 Tachycardia, unspecified: Secondary | ICD-10-CM | POA: Diagnosis not present

## 2020-08-18 DIAGNOSIS — N858 Other specified noninflammatory disorders of uterus: Secondary | ICD-10-CM | POA: Diagnosis not present

## 2020-08-18 DIAGNOSIS — R1032 Left lower quadrant pain: Secondary | ICD-10-CM | POA: Diagnosis present

## 2020-08-18 LAB — COMPREHENSIVE METABOLIC PANEL
ALT: 19 U/L (ref 0–44)
AST: 20 U/L (ref 15–41)
Albumin: 4.7 g/dL (ref 3.5–5.0)
Alkaline Phosphatase: 40 U/L (ref 38–126)
Anion gap: 10 (ref 5–15)
BUN: 10 mg/dL (ref 6–20)
CO2: 24 mmol/L (ref 22–32)
Calcium: 9.2 mg/dL (ref 8.9–10.3)
Chloride: 102 mmol/L (ref 98–111)
Creatinine, Ser: 0.84 mg/dL (ref 0.44–1.00)
GFR, Estimated: >60 mL/min (ref >60)
Glucose, Bld: 96 mg/dL (ref 70–99)
Potassium: 3.1 mmol/L — ABNORMAL LOW (ref 3.5–5.1)
Sodium: 136 mmol/L (ref 135–145)
Total Bilirubin: 0.6 mg/dL (ref 0.3–1.2)
Total Protein: 7.4 g/dL (ref 6.5–8.1)

## 2020-08-18 LAB — URINALYSIS, ROUTINE W REFLEX MICROSCOPIC
Bilirubin Urine: NEGATIVE
Glucose, UA: NEGATIVE mg/dL
Hgb urine dipstick: NEGATIVE
Ketones, ur: NEGATIVE mg/dL
Leukocytes,Ua: NEGATIVE
Nitrite: NEGATIVE
Protein, ur: NEGATIVE mg/dL
Specific Gravity, Urine: 1.01 (ref 1.005–1.030)
pH: 7 (ref 5.0–8.0)

## 2020-08-18 LAB — CBC WITH DIFFERENTIAL/PLATELET
Abs Immature Granulocytes: 0.02 10*3/uL (ref 0.00–0.07)
Basophils Absolute: 0 10*3/uL (ref 0.0–0.1)
Basophils Relative: 1 %
Eosinophils Absolute: 0.1 10*3/uL (ref 0.0–0.5)
Eosinophils Relative: 1 %
HCT: 42.9 % (ref 36.0–46.0)
Hemoglobin: 14.8 g/dL (ref 12.0–15.0)
Immature Granulocytes: 0 %
Lymphocytes Relative: 32 %
Lymphs Abs: 2.1 10*3/uL (ref 0.7–4.0)
MCH: 30.7 pg (ref 26.0–34.0)
MCHC: 34.5 g/dL (ref 30.0–36.0)
MCV: 89 fL (ref 80.0–100.0)
Monocytes Absolute: 0.5 10*3/uL (ref 0.1–1.0)
Monocytes Relative: 7 %
Neutro Abs: 4 10*3/uL (ref 1.7–7.7)
Neutrophils Relative %: 59 %
Platelets: 258 10*3/uL (ref 150–400)
RBC: 4.82 MIL/uL (ref 3.87–5.11)
RDW: 12.3 % (ref 11.5–15.5)
WBC: 6.6 10*3/uL (ref 4.0–10.5)
nRBC: 0 % (ref 0.0–0.2)

## 2020-08-18 LAB — LIPASE, BLOOD: Lipase: 30 U/L (ref 11–51)

## 2020-08-18 LAB — PREGNANCY, URINE: Preg Test, Ur: NEGATIVE

## 2020-08-18 MED ORDER — POTASSIUM CHLORIDE CRYS ER 20 MEQ PO TBCR
40.0000 meq | EXTENDED_RELEASE_TABLET | Freq: Once | ORAL | Status: AC
  Administered 2020-08-18: 40 meq via ORAL
  Filled 2020-08-18: qty 2

## 2020-08-18 MED ORDER — ACETAMINOPHEN 325 MG PO TABS
650.0000 mg | ORAL_TABLET | Freq: Once | ORAL | Status: AC
  Administered 2020-08-18: 650 mg via ORAL
  Filled 2020-08-18: qty 2

## 2020-08-18 MED ORDER — CELECOXIB 200 MG PO CAPS: 200.0000 mg | ORAL_CAPSULE | Freq: Two times a day (BID) | ORAL | 0 refills | Status: DC

## 2020-08-18 MED ORDER — SODIUM CHLORIDE 0.9 % IV BOLUS
1000.0000 mL | Freq: Once | INTRAVENOUS | Status: AC
  Administered 2020-08-18: 1000 mL via INTRAVENOUS

## 2020-08-18 MED ORDER — IOHEXOL 300 MG/ML  SOLN
100.0000 mL | Freq: Once | INTRAMUSCULAR | Status: AC
  Administered 2020-08-18: 100 mL via INTRAVENOUS

## 2020-08-18 NOTE — ED Notes (Signed)
Pt @ US

## 2020-08-18 NOTE — ED Triage Notes (Signed)
Pt c/o LLQ pain started yesterday-NAD-slow gait

## 2020-08-18 NOTE — Discharge Instructions (Signed)

## 2020-08-18 NOTE — ED Notes (Signed)
Pt given crackers for potassium  Pt partner given soda and kind bar

## 2020-08-18 NOTE — ED Provider Notes (Signed)
[facility] EMERGENCY DEPARTMENT Provider Note   CSN: [id number] Arrival date & time: 08/18/20  1333     History Chief Complaint  Patient presents with  . Abdominal Pain    Danielle Reynolds is a 35 y.o. female who presents emergency department with chief complaint of left lower quadrant abdominal pain.  Patient states that she began having aching pain in her left lower quadrant yesterday.  Today while she was at work she developed sharp pain in the left lower quadrant.  Pain is intermittent, nothing makes it worse or better, it does not radiate, she has taken Motrin without significant improvement in her pain.  She rates the pain at 7 out of 10 last menstrual period was last week.  She denies any vaginal symptoms, urinary symptoms, history of kidney stones.  She had a normal bowel movement this morning which she takes daily.  She denies any constipation, nausea, vomiting, cold sweats.  The patient does have a history of previous appendectomy.  She denies fever or chills.  HPI     Past Medical History:  Diagnosis Date  . ADHD     Patient Active Problem List   Diagnosis Date Noted  . Acute appendicitis 06/19/2019    Past Surgical History:  Procedure Laterality Date  . LAPAROSCOPIC APPENDECTOMY N/A 06/19/2019   Procedure: APPENDECTOMY LAPAROSCOPIC;  Surgeon: Romie Levee, MD;  Location: [facility] ORS;  Service: General;  Laterality: N/A;     OB History   No obstetric history on file.     Family History  Problem Relation Age of Onset  . Healthy Mother   . Hypertension Father     Social History   Tobacco Use  . Smoking status: Current Some Day Smoker    Types: Cigarettes  . Smokeless tobacco: Never Used  Vaping Use  . Vaping Use: Never used  Substance Use Topics  . Alcohol use: Yes    Comment: occ  . Drug use: Never    Home Medications Prior to Admission medications   Medication Sig Start Date End Date Taking? Authorizing Provider  traMADol (ULTRAM) 50 MG  tablet Take 1 tablet (50 mg total) by mouth every 6 (six) hours as needed for moderate pain. 06/20/19   Jamaris Theard Miyamoto, MD  VYVANSE 70 MG capsule Take 70 mg by mouth every morning. 06/01/19   [provider]    Allergies    Codeine  Review of Systems   Review of Systems Ten systems reviewed and are negative for acute change, except as noted in the HPI.   Physical Exam Updated Vital Signs BP (!) 154/103 (BP Location: Left Arm)   Pulse (!) 119   Temp 97.9 F (36.6 C) (Oral)   Resp 16   Ht 5\' 9"  (1.753 m)   Wt 78 kg   LMP 08/09/2020   SpO2 100%   BMI 25.40 kg/m   Physical Exam Vitals and nursing note reviewed.  Constitutional:      General: She is not in acute distress.    Appearance: She is well-developed. She is not diaphoretic.  HENT:     Head: Normocephalic and atraumatic.  Eyes:     General: No scleral icterus.    Conjunctiva/sclera: Conjunctivae normal.  Cardiovascular:     Rate and Rhythm: Regular rhythm. Tachycardia present.     Heart sounds: Normal heart sounds. No murmur heard. No friction rub. No gallop.   Pulmonary:     Effort: Pulmonary effort is normal. No respiratory distress.  Breath sounds: Normal breath sounds.  Abdominal:     General: Bowel sounds are normal. There is no distension.     Palpations: Abdomen is soft. There is no mass.     Tenderness: There is abdominal tenderness in the left lower quadrant. There is guarding. There is no right CVA tenderness or left CVA tenderness.     Comments: Point tender in the left lower quadrant with guarding  Musculoskeletal:     Cervical back: Normal range of motion.  Skin:    General: Skin is warm and dry.  Neurological:     Mental Status: She is alert and oriented to person, place, and time.  Psychiatric:        Behavior: Behavior normal.     ED Results / Procedures / Treatments   Labs (all labs ordered are listed, but only abnormal results are displayed) Labs Reviewed  URINALYSIS,  ROUTINE W REFLEX MICROSCOPIC  PREGNANCY, URINE  CBC WITH DIFFERENTIAL/PLATELET  COMPREHENSIVE METABOLIC PANEL  LIPASE, BLOOD    EKG None  Radiology No results found.  Procedures Procedures   Medications Ordered in ED Medications  sodium chloride 0.9 % bolus 1,000 mL (has no administration in time range)  acetaminophen (TYLENOL) tablet 650 mg (has no administration in time range)    ED Course  I have reviewed the triage vital signs and the nursing notes.  Pertinent labs & imaging results that were available during my care of the patient were reviewed by me and considered in my medical decision making (see chart for details).  Clinical Course as of 08/18/20 1515  Thu Aug 18, 2020  1515 Potassium(!): 3.1 [AH]    Clinical Course User Index [AH] Arthor Captain, PA-C   MDM Rules/Calculators/A&P                          CC:llq ABD PAIN VS: BP 133/86   Pulse 94   Temp 97.9 F (36.6 C) (Oral)   Resp 18   Ht 5\' 9"  (1.753 m)   Wt 78 kg   LMP 08/09/2020   SpO2 100%   BMI 25.40 kg/m   10/09/2020 is gathered by patient  and husband. Previous records obtained and reviewed. DDX:The patient's complaint of LLQ pain  involves an extensive number of diagnostic and treatment options, and is a complaint that carries with it a high risk of complications, morbidity, and potential mortality. Given the large differential diagnosis, medical decision making is of high complexity. The differential diagnosis of emergent flank pain includes, but is not limited to :Abdominal aortic aneurysm,, Renal artery embolism,Renal vein thrombosis, Aortic dissection, Mesenteric ischemia, Pyelonephritis, Renal infarction, Renal hemorrhage, Nephrolithiasis/ Renal Colic, Bladder tumor,Cystitis, Biliary colic, Pancreatitis,Perforated peptic ulcer Appendicitis ,Inguinal Hernia, Diverticulitis, Bowel obstruction Ectopic Pregnancy,PID/TOA,Ovarian cyst, Ovarian torsion Shingles Lower lobe pneumonia, Retroperitoneal  hematoma/abscess/tumor, Epidural abscess, Epidural hematoma  Labs: I ordered reviewed and interpreted labs which included CBC without significant abnormality, lipase within normal limits, CMP with mild hypokalemia repleted orally here, urine without evidence of infection, negative urine pregnancy test  Imaging: I ordered and reviewed images which included pelvic and transvaginal ultrasound with Doppler imaging of the ovaries as well as. I independently visualized and interpreted all imaging. Significant findings include suspected fibroid and mass within the uterus suspicious for potential endometrial cyst or polyp mass in the, there are no other acute findings on imaging today EKG: Consults: MDM: Patient here with left lower quadrant pelvic pain.  There are no acute  findings on her work-up today.  Suspect this is secondary to likely endometrial polyp.  We will have the patient follow closely within the next 4 to 6 weeks for follow-up with GYN..  Discussed return precaution Patient disposition:The patient appears reasonably screened and/or stabilized for discharge and I doubt any other medical condition or other [facility] requiring further screening, evaluation, or treatment in the ED at this time prior to discharge. I have discussed lab and/or imaging findings with the patient and answered all questions/concerns to the best of my ability.I have discussed return precautions and OP follow up.    Final Clinical Impression(s) / ED Diagnoses Final diagnoses:  LLQ pain    Rx / DC Orders ED Discharge Orders    None       Arthor Captain, PA-C 08/18/20 1729    Virgina Norfolk, DO 08/19/20 [phone]

## 2020-09-05 ENCOUNTER — Other Ambulatory Visit: Payer: Self-pay

## 2020-09-05 ENCOUNTER — Other Ambulatory Visit (HOSPITAL_COMMUNITY)
Admission: RE | Admit: 2020-09-05 | Discharge: 2020-09-05 | Disposition: A | Discharge status: Home / Self Care | Payer: Managed Care, Other (non HMO) | Source: Ambulatory Visit | Attending: Family Medicine | Admitting: Family Medicine

## 2020-09-05 ENCOUNTER — Encounter: Payer: Self-pay | Admitting: Family Medicine

## 2020-09-05 ENCOUNTER — Ambulatory Visit: Payer: BC Managed Care – PPO | Admitting: Family Medicine

## 2020-09-05 VITALS — BP 134/77 | HR 91 | Ht 69.0 in | Wt 175.0 lb

## 2020-09-05 DIAGNOSIS — R102 Pelvic and perineal pain: Secondary | ICD-10-CM

## 2020-09-05 DIAGNOSIS — Z124 Encounter for screening for malignant neoplasm of cervix: Secondary | ICD-10-CM | POA: Diagnosis not present

## 2020-09-05 DIAGNOSIS — N920 Excessive and frequent menstruation with regular cycle: Secondary | ICD-10-CM | POA: Diagnosis not present

## 2020-09-05 NOTE — Progress Notes (Signed)
   Subjective:    Patient ID: Danielle Reynolds is a 35 y.o. female presenting with No chief complaint on file.  on 09/05/2020  HPI: [id number] Seen in ED in April with LLQ pain and felt crampy the day before, and then it became sharp pain, felt like her appendix pain. Left side and pinpoint pain, sometimes it is stronger and sometimes is less. Went to the ED for the same, in her 30s. Always left sided, usually comes and goes. Ibuprofen or tylenol has helped somewhat.Over the last few years, her cycles have gotten heavier. Cycles are regular. Does not want children. Works for [employer] development [employer]. Is married, no contraception.  Review of Systems  Constitutional: Negative for chills and fever.  Respiratory: Negative for shortness of breath.   Cardiovascular: Negative for chest pain.  Gastrointestinal: Negative for abdominal pain, nausea and vomiting.  Genitourinary: Negative for dysuria.  Skin: Negative for rash.      Objective:    BP 134/77   Pulse 91   Ht 5\' 9"  (1.753 m)   Wt 175 lb (79.4 kg)   LMP 08/09/2020   BMI 25.84 kg/m  Physical Exam Constitutional:      General: She is not in acute distress.    Appearance: She is well-developed.  HENT:     Head: Normocephalic and atraumatic.  Eyes:     General: No scleral icterus. Cardiovascular:     Rate and Rhythm: Normal rate.  Pulmonary:     Effort: Pulmonary effort is normal.  Abdominal:     Palpations: Abdomen is soft.  Genitourinary:    Comments: BUS normal, vagina is pink and rugated, cervix is nulliparous without lesion, uterus is small and anteverted, no adnexal mass or tenderness.  Musculoskeletal:     Cervical back: Neck supple.  Skin:    General: Skin is warm and dry.  Neurological:     Mental Status: She is alert and oriented to person, place, and time.         Assessment & Plan:   Problem List Items Addressed This Visit      Unprioritized   Pelvic pain    Does not seem cyclical, no pelvic organ tenderness, suspect  MSK cause--will refer to PT.      Relevant Orders   Ambulatory referral to Physical Therapy   Menorrhagia with regular cycle    With uterine mass, likely polyp--will book for D & C with Hysteroscopic resection. Risks include but are not limited to bleeding, infection, injury to surrounding structures, including bowel, bladder and ureters, blood clots, and death, though with this minor procedure that is very unlikely.  Likelihood of success is high.        Other Visit Diagnoses    Cervical cancer screening    -  Primary   Relevant Orders   Cytology - PAP( [place])      Total time in review of prior notes, imaging,  history taking, review with patient, exam, note writing, discussion of options, plan for next steps including surgery and discussion of the proposed surgery, time out of work, what to expect and risks, alternatives and risks of treatment: 33 minutes.  Return in about 3 months (around 12/06/2020) for postop check.  02/05/2021 09/05/2020 7:24 PM

## 2020-09-05 NOTE — Assessment & Plan Note (Signed)
With uterine mass, likely polyp--will book for D & C with Hysteroscopic resection. Risks include but are not limited to bleeding, infection, injury to surrounding structures, including bowel, bladder and ureters, blood clots, and death, though with this minor procedure that is very unlikely.  Likelihood of success is high.

## 2020-09-05 NOTE — Assessment & Plan Note (Signed)
Does not seem cyclical, no pelvic organ tenderness, suspect MSK cause--will refer to PT.

## 2020-09-06 ENCOUNTER — Encounter: Payer: Self-pay | Admitting: *Deleted

## 2020-09-06 ENCOUNTER — Telehealth: Payer: Self-pay | Admitting: *Deleted

## 2020-09-06 NOTE — Telephone Encounter (Signed)
Call to patient. Left message to return call related to "scheduling." No details left on voice mail.  Surgery is scheduled for 09-14-20 at 1100 at [facility]- Needs arrival by 0900.

## 2020-09-07 ENCOUNTER — Encounter: Payer: Self-pay | Admitting: *Deleted

## 2020-09-07 LAB — CYTOLOGY - PAP
Comment: NEGATIVE
Diagnosis: NEGATIVE
High risk HPV: NEGATIVE

## 2020-09-07 NOTE — Telephone Encounter (Signed)
Message received that patient wants to reschedule surgery.

## 2020-09-07 NOTE — Telephone Encounter (Signed)
Return call to patient.  Reports she cant be ready for surgery next week.. Discussed next available dates and patient prefers July 6. Advised will schedule for 1pm but start time may change as date approaches.   Encounter closed.

## 2020-09-12 ENCOUNTER — Other Ambulatory Visit (HOSPITAL_COMMUNITY): Payer: Managed Care, Other (non HMO)

## 2020-09-21 ENCOUNTER — Encounter: Payer: BC Managed Care – PPO | Admitting: Family Medicine

## 2020-10-13 IMAGING — CT CT ABD-PELV W/ CM
2 of 4 series · 16 of 46 positions shown, 18 images · IV contrast (OMNIPAQUE 300)
Comparison: None.

CLINICAL DATA: Right lower quadrant pain since yesterday. Suspect
appendicitis.

EXAM:
CT ABDOMEN AND PELVIS WITH CONTRAST
TECHNIQUE: Multidetector CT imaging of the abdomen and pelvis was performed
using the standard protocol following bolus administration of
intravenous contrast.
CONTRAST:  100mL OMNIPAQUE IOHEXOL 300 MG/ML  SOLN

[Series 3: axial st · axial · 0.79mm/px · z∈[-505,-75]mm · 13 of 96 slices shown, 15 images]
[im 5/96  soft-tissue]
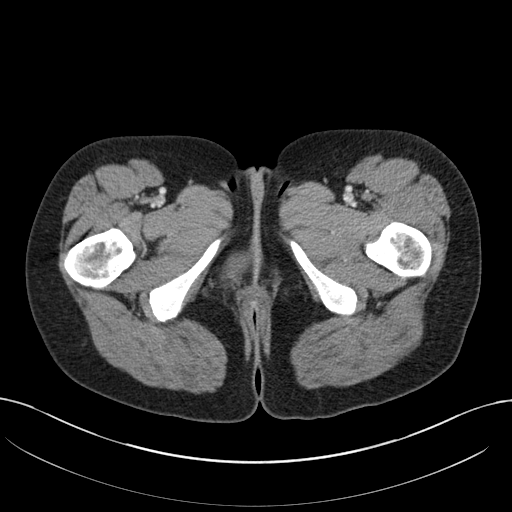
[im 5/96  bone]
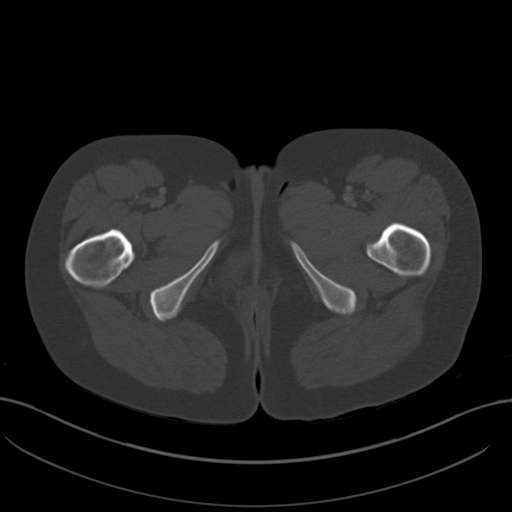
[im 15/96  soft-tissue]
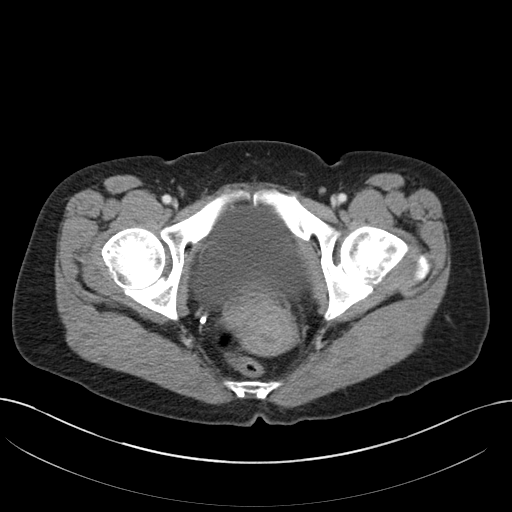
[im 20/96  soft-tissue]
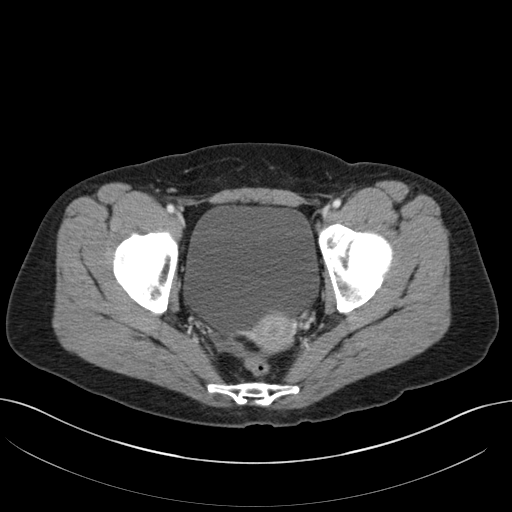
[im 29/96  soft-tissue]
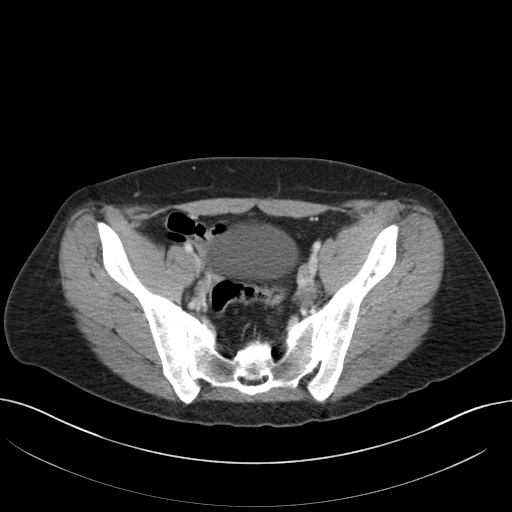
[im 34/96  soft-tissue]
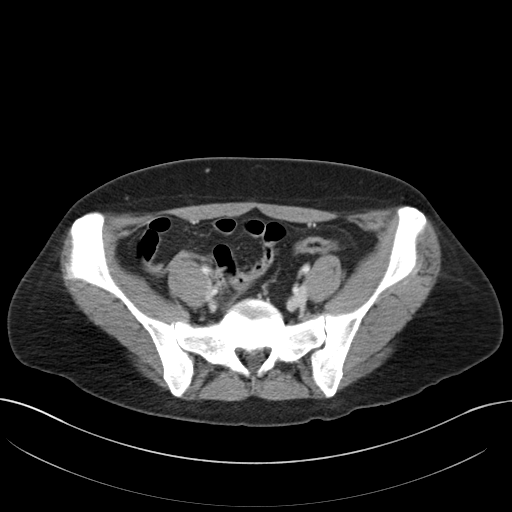
[im 43/96  soft-tissue]
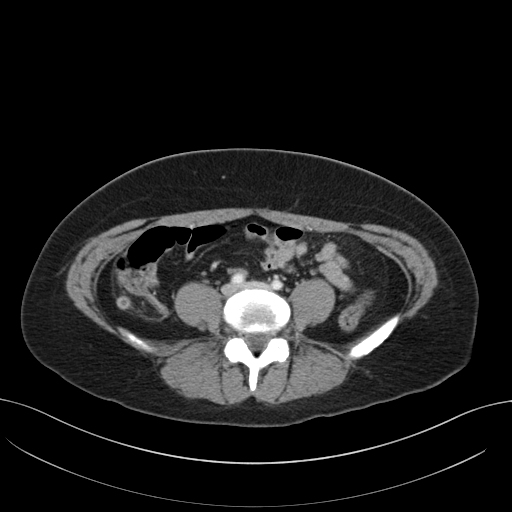
[im 48/96  soft-tissue]
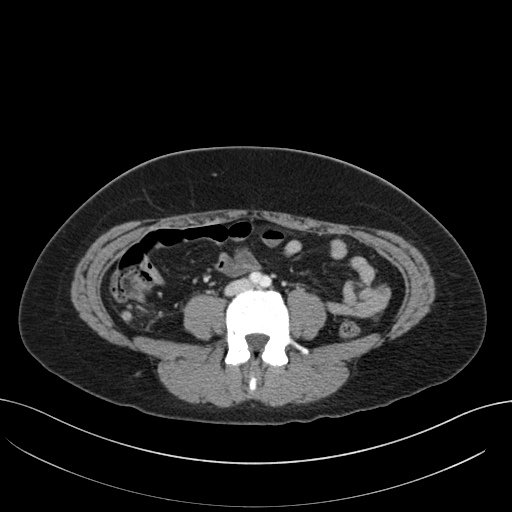
[im 53/96  soft-tissue]
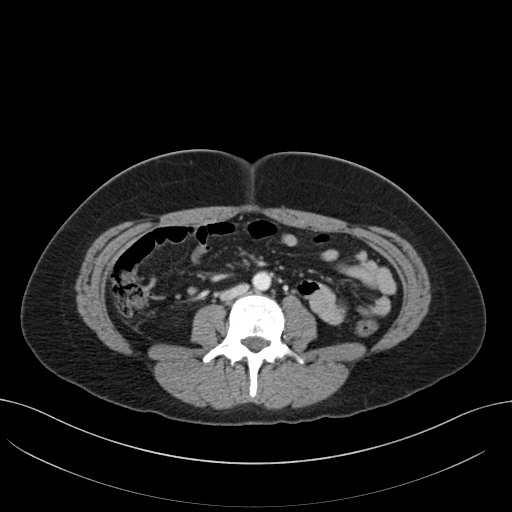
[im 62/96  soft-tissue]
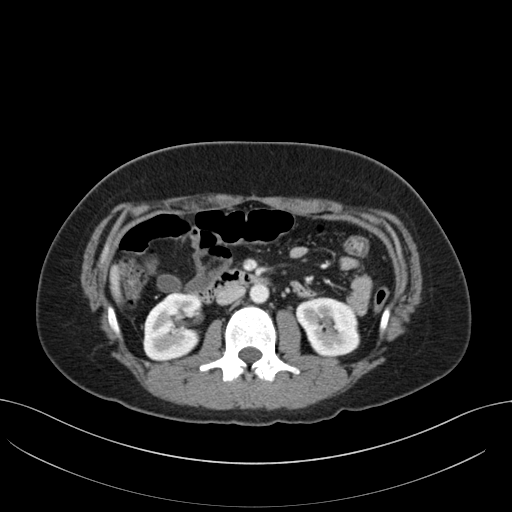
[im 62/96  bone]
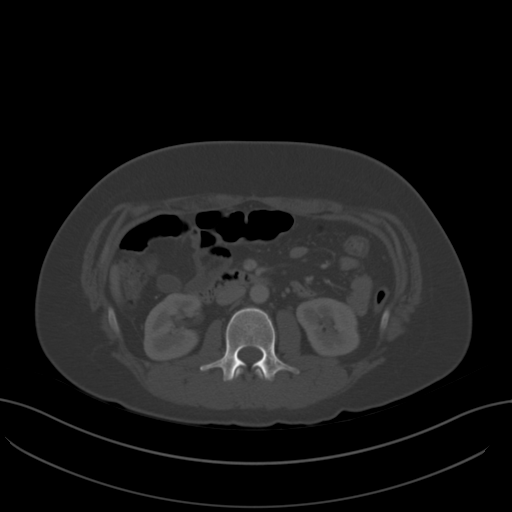
[im 67/96  soft-tissue]
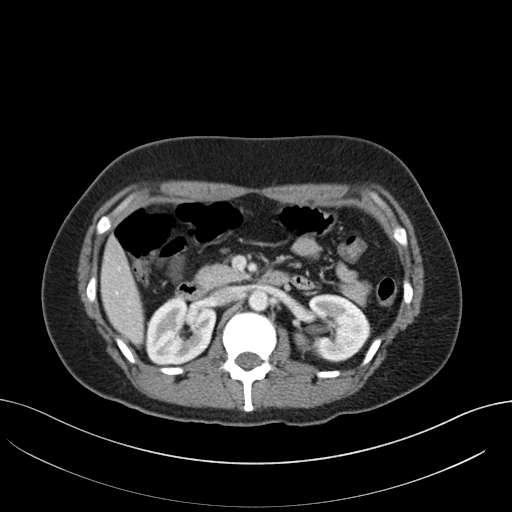
[im 77/96  soft-tissue]
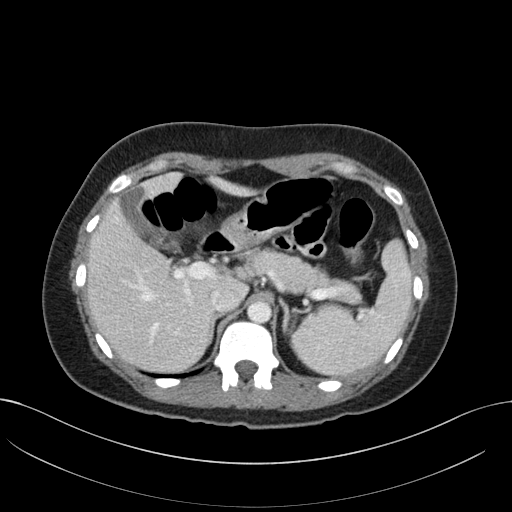
[im 81/96  soft-tissue]
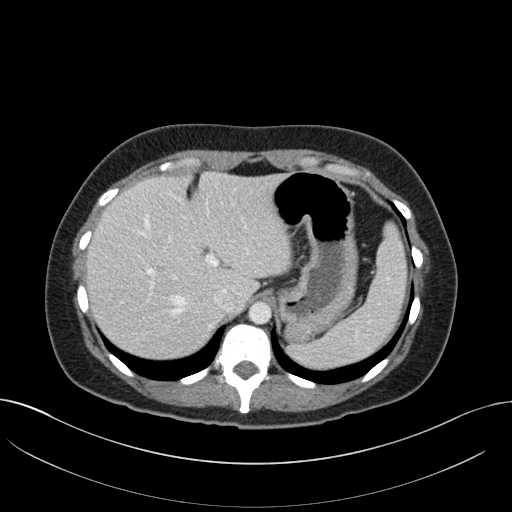
[im 91/96  soft-tissue]
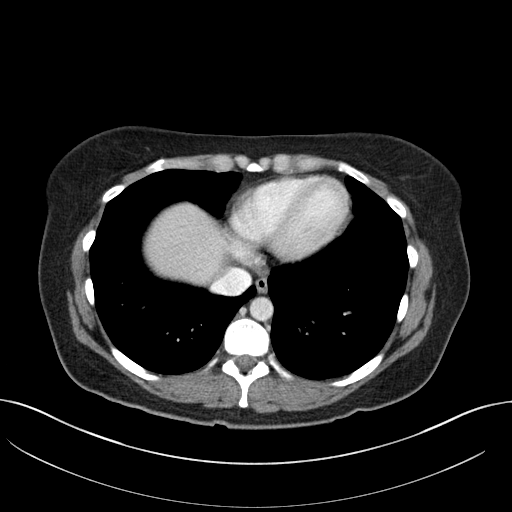

[Series 4: coronal st · coronal · 0.75mm/px · 3 of 120 slices shown]
[im 40/120  soft-tissue]
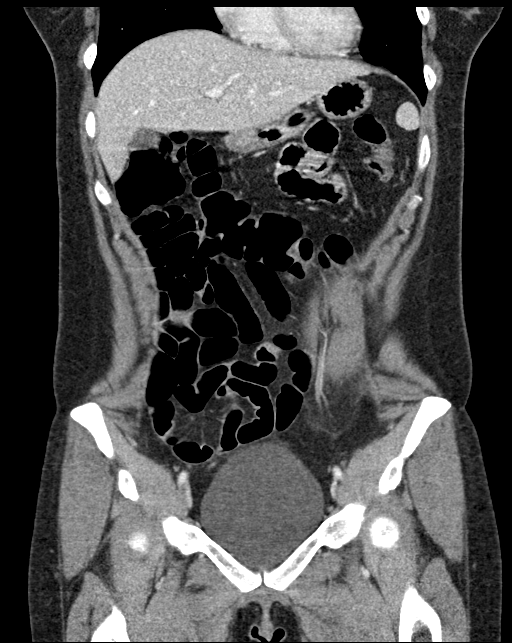
[im 53/120  soft-tissue]
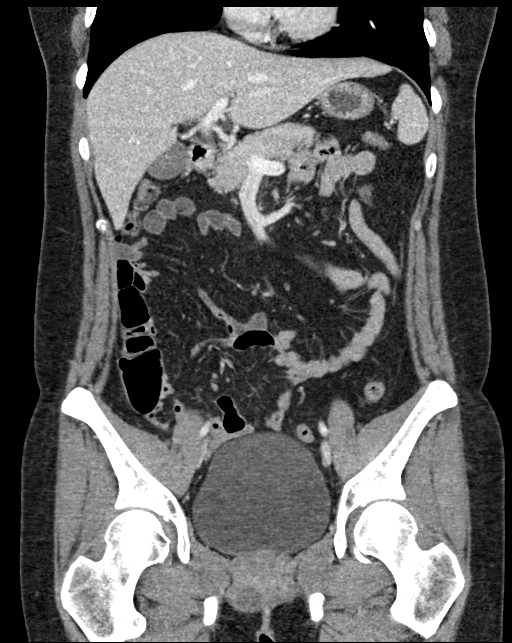
[im 67/120  soft-tissue]
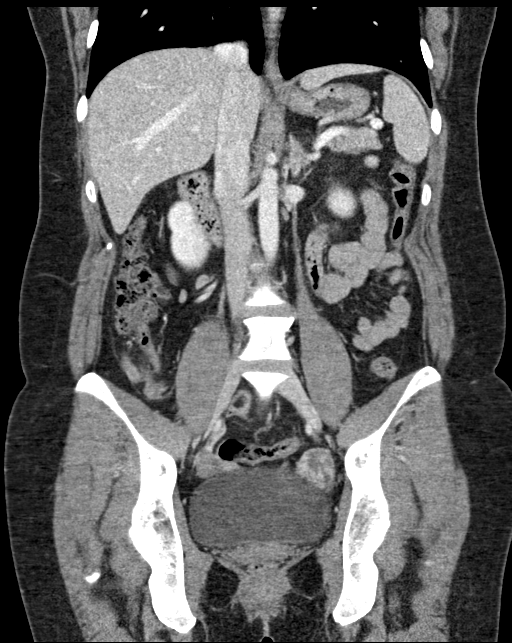

[16 of 46 positions shown; findings below may reference images not displayed]

FINDINGS: Lower chest: Lung bases are clear.

Hepatobiliary: Liver, gallbladder and biliary tree are normal.

Pancreas: Normal.

Spleen: Normal.

Adrenals/Urinary Tract: Adrenal glands are normal. Kidneys are
normal size without hydronephrosis or nephrolithiasis. Ureters and
bladder are normal.

Stomach/Bowel: Stomach and small bowel are normal. Appendix is
retrocecal in location and demonstrates mucosal enhancement and is
mildly dilated measuring 9-10 mm. There is subtle adjacent stranding
of the adjacent fat and tiny amount of adjacent free fluid over the
pericolic gutter. No evidence of perforation or abscess. No
significant free fluid. Findings likely due to early acute
appendicitis. Colon is normal

Vascular/Lymphatic: No significant vascular findings are present. No
enlarged abdominal or pelvic lymph nodes.

Reproductive: Uterus and bilateral adnexa are unremarkable.

Other: None.

Musculoskeletal: No acute or significant osseous findings.
IMPRESSION: Findings compatible with early acute appendicitis. No evidence of
perforation or abscess.

These results were called by telephone at the time of interpretation
acknowledged these results.

## 2020-10-18 ENCOUNTER — Other Ambulatory Visit: Payer: Self-pay

## 2020-10-18 ENCOUNTER — Encounter (HOSPITAL_BASED_OUTPATIENT_CLINIC_OR_DEPARTMENT_OTHER): Payer: Self-pay | Admitting: Family Medicine

## 2020-10-24 DIAGNOSIS — N858 Other specified noninflammatory disorders of uterus: Secondary | ICD-10-CM | POA: Diagnosis present

## 2020-10-24 NOTE — H&P (Signed)
Danielle Reynolds is an 35 y.o. G0P0000 female.   Chief Complaint: uterine mass HPI: found to have a uterine mass, with w/u related to LLQ pain. Also has menorrhagia. ? Fibroid or Polyp, for diagnosis and treatment.   Past Medical History:  Diagnosis Date   ADHD     Past Surgical History:  Procedure Laterality Date   LAPAROSCOPIC APPENDECTOMY N/A 06/19/2019   Procedure: APPENDECTOMY LAPAROSCOPIC;  Surgeon: Romie Levee, MD;  Location: [facility];  Service: General;  Laterality: N/A;    Family History  Problem Relation Age of Onset   Healthy Mother    Hypertension Father    Social History:  reports that she has been smoking cigarettes. She has never used smokeless tobacco. She reports current alcohol use. She reports that she does not use drugs.  Allergies:  Allergies  Allergen Reactions   Codeine Nausea And Vomiting    No medications prior to admission.    Pertinent items are noted in HPI.  Height 5\' 9"  (1.753 m), weight 78 kg, last menstrual period 10/03/2020. General appearance: alert, cooperative, and appears stated age Head: Normocephalic, without obvious abnormality, atraumatic Neck: supple, symmetrical, trachea midline Lungs:  normal effort Heart: regular rate and rhythm Abdomen: soft, non-tender; bowel sounds normal; no masses,  no organomegaly Extremities: extremities normal, atraumatic, no cyanosis or edema Skin: Skin color, texture, turgor normal. No rashes or lesions Neurologic: Grossly normal   Lab Results  Component Value Date   WBC 6.6 08/18/2020   HGB 14.8 08/18/2020   HCT 42.9 08/18/2020   MCV 89.0 08/18/2020   PLT 258 08/18/2020   Lab Results  Component Value Date   PREGTESTUR NEGATIVE 08/18/2020   HCG <5.0 06/19/2019   IMPRESSION: 1. Negative for ovarian torsion. 2. 11 mm hypoechoic mass within the fundal endometrium. A focal endometrial lesion is suspected. Consider sonohysterogram for further evaluation, prior to hysteroscopy or endometrial  biopsy. 3. Small uterine fibroid  Assessment/Plan Principal Problem:   Uterine mass Active Problems:   Menorrhagia with regular cycle  For D & C and Hysteroscopy with hysteroscopic resection of mass. Risks include but are not limited to bleeding, infection, injury to surrounding structures, including bowel, bladder and ureters, blood clots, and death.  Likelihood of success is high.    06/21/2019 10/24/2020, 5:02 PM

## 2020-11-01 ENCOUNTER — Encounter (HOSPITAL_BASED_OUTPATIENT_CLINIC_OR_DEPARTMENT_OTHER)
Admission: RE | Admit: 2020-11-01 | Discharge: 2020-11-01 | Disposition: A | Discharge status: Home / Self Care | Payer: Managed Care, Other (non HMO) | Source: Ambulatory Visit | Attending: Family Medicine | Admitting: Family Medicine

## 2020-11-01 DIAGNOSIS — F17210 Nicotine dependence, cigarettes, uncomplicated: Secondary | ICD-10-CM | POA: Diagnosis not present

## 2020-11-01 DIAGNOSIS — Z01812 Encounter for preprocedural laboratory examination: Secondary | ICD-10-CM | POA: Insufficient documentation

## 2020-11-01 DIAGNOSIS — N840 Polyp of corpus uteri: Secondary | ICD-10-CM | POA: Diagnosis not present

## 2020-11-01 DIAGNOSIS — Z885 Allergy status to narcotic agent status: Secondary | ICD-10-CM | POA: Diagnosis not present

## 2020-11-01 DIAGNOSIS — N920 Excessive and frequent menstruation with regular cycle: Secondary | ICD-10-CM | POA: Diagnosis present

## 2020-11-01 LAB — CBC
HCT: 42.6 % (ref 36.0–46.0)
Hemoglobin: 14.6 g/dL (ref 12.0–15.0)
MCH: 30.5 pg (ref 26.0–34.0)
MCHC: 34.3 g/dL (ref 30.0–36.0)
MCV: 89.1 fL (ref 80.0–100.0)
Platelets: 245 10*3/uL (ref 150–400)
RBC: 4.78 MIL/uL (ref 3.87–5.11)
RDW: 12.6 % (ref 11.5–15.5)
WBC: 8.7 10*3/uL (ref 4.0–10.5)
nRBC: 0 % (ref 0.0–0.2)

## 2020-11-01 LAB — POCT PREGNANCY, URINE: Preg Test, Ur: NEGATIVE

## 2020-11-01 NOTE — Progress Notes (Signed)
Reminded pt to come in for lab work. Pt verbalized understanding.

## 2020-11-02 ENCOUNTER — Telehealth: Payer: Self-pay | Admitting: General Practice

## 2020-11-02 ENCOUNTER — Encounter (HOSPITAL_BASED_OUTPATIENT_CLINIC_OR_DEPARTMENT_OTHER): Payer: Self-pay | Admitting: Family Medicine

## 2020-11-02 ENCOUNTER — Ambulatory Visit (HOSPITAL_BASED_OUTPATIENT_CLINIC_OR_DEPARTMENT_OTHER): Payer: Managed Care, Other (non HMO) | Admitting: Certified Registered"

## 2020-11-02 ENCOUNTER — Encounter (HOSPITAL_BASED_OUTPATIENT_CLINIC_OR_DEPARTMENT_OTHER)
Admission: RE | Disposition: A | Discharge status: Home / Self Care | Payer: Self-pay | Source: Ambulatory Visit | Attending: Family Medicine

## 2020-11-02 ENCOUNTER — Ambulatory Visit (HOSPITAL_BASED_OUTPATIENT_CLINIC_OR_DEPARTMENT_OTHER)
Admission: RE | Admit: 2020-11-02 | Discharge: 2020-11-02 | Disposition: A | Discharge status: Home / Self Care | Payer: Managed Care, Other (non HMO) | Source: Ambulatory Visit | Attending: Family Medicine | Admitting: Family Medicine

## 2020-11-02 DIAGNOSIS — N840 Polyp of corpus uteri: Secondary | ICD-10-CM | POA: Insufficient documentation

## 2020-11-02 DIAGNOSIS — N858 Other specified noninflammatory disorders of uterus: Secondary | ICD-10-CM

## 2020-11-02 DIAGNOSIS — N926 Irregular menstruation, unspecified: Secondary | ICD-10-CM

## 2020-11-02 DIAGNOSIS — F17210 Nicotine dependence, cigarettes, uncomplicated: Secondary | ICD-10-CM | POA: Insufficient documentation

## 2020-11-02 DIAGNOSIS — N920 Excessive and frequent menstruation with regular cycle: Secondary | ICD-10-CM | POA: Diagnosis present

## 2020-11-02 DIAGNOSIS — Z885 Allergy status to narcotic agent status: Secondary | ICD-10-CM | POA: Insufficient documentation

## 2020-11-02 HISTORY — PX: DILATATION & CURETTAGE/HYSTEROSCOPY WITH MYOSURE: SHX6511

## 2020-11-02 SURGERY — DILATATION & CURETTAGE/HYSTEROSCOPY WITH MYOSURE
Anesthesia: General | Site: Uterus

## 2020-11-02 MED ORDER — MIDAZOLAM HCL 2 MG/2ML IJ SOLN
INTRAMUSCULAR | Status: AC
  Filled 2020-11-02: qty 2

## 2020-11-02 MED ORDER — PROPOFOL 10 MG/ML IV BOLUS
INTRAVENOUS | Status: AC
  Filled 2020-11-02: qty 40

## 2020-11-02 MED ORDER — KETOROLAC TROMETHAMINE 30 MG/ML IJ SOLN
INTRAMUSCULAR | Status: DC | PRN
  Administered 2020-11-02: 30 mg via INTRAVENOUS

## 2020-11-02 MED ORDER — ACETAMINOPHEN 500 MG PO TABS: 1000.0000 mg | ORAL_TABLET | ORAL | Status: DC

## 2020-11-02 MED ORDER — ONDANSETRON HCL 4 MG/2ML IJ SOLN
INTRAMUSCULAR | Status: DC | PRN
  Administered 2020-11-02: 4 mg via INTRAVENOUS

## 2020-11-02 MED ORDER — LIDOCAINE HCL (CARDIAC) PF 100 MG/5ML IV SOSY
PREFILLED_SYRINGE | INTRAVENOUS | Status: DC | PRN
  Administered 2020-11-02: 100 mg via INTRAVENOUS

## 2020-11-02 MED ORDER — DEXAMETHASONE SODIUM PHOSPHATE 10 MG/ML IJ SOLN
INTRAMUSCULAR | Status: DC | PRN
  Administered 2020-11-02: 5 mg via INTRAVENOUS

## 2020-11-02 MED ORDER — SCOPOLAMINE 1 MG/3DAYS TD PT72: 1.0000 | MEDICATED_PATCH | Freq: Once | TRANSDERMAL | Status: DC

## 2020-11-02 MED ORDER — OXYCODONE HCL 5 MG/5ML PO SOLN: 5.0000 mg | Freq: Once | ORAL | Status: DC | PRN

## 2020-11-02 MED ORDER — 0.9 % SODIUM CHLORIDE (POUR BTL) OPTIME
TOPICAL | Status: DC | PRN
  Administered 2020-11-02: 1000 mL

## 2020-11-02 MED ORDER — ACETAMINOPHEN 500 MG PO TABS: 1000.0000 mg | ORAL_TABLET | Freq: Once | ORAL | Status: DC

## 2020-11-02 MED ORDER — MIDAZOLAM HCL 5 MG/5ML IJ SOLN
INTRAMUSCULAR | Status: DC | PRN
  Administered 2020-11-02: 2 mg via INTRAVENOUS

## 2020-11-02 MED ORDER — ONDANSETRON HCL 4 MG/2ML IJ SOLN
INTRAMUSCULAR | Status: AC
  Filled 2020-11-02: qty 2

## 2020-11-02 MED ORDER — FENTANYL CITRATE (PF) 100 MCG/2ML IJ SOLN
INTRAMUSCULAR | Status: AC
  Filled 2020-11-02: qty 2

## 2020-11-02 MED ORDER — LACTATED RINGERS IV SOLN: INTRAVENOUS | Status: DC

## 2020-11-02 MED ORDER — OXYCODONE HCL 5 MG PO TABS: 5.0000 mg | ORAL_TABLET | Freq: Once | ORAL | Status: DC | PRN

## 2020-11-02 MED ORDER — PROMETHAZINE HCL 25 MG/ML IJ SOLN: 6.2500 mg | INTRAMUSCULAR | Status: DC | PRN

## 2020-11-02 MED ORDER — KETOROLAC TROMETHAMINE 30 MG/ML IJ SOLN
INTRAMUSCULAR | Status: AC
  Filled 2020-11-02: qty 1

## 2020-11-02 MED ORDER — CEFAZOLIN SODIUM-DEXTROSE 2-4 GM/100ML-% IV SOLN
INTRAVENOUS | Status: AC
  Filled 2020-11-02: qty 100

## 2020-11-02 MED ORDER — LIDOCAINE HCL (PF) 2 % IJ SOLN
INTRAMUSCULAR | Status: AC
  Filled 2020-11-02: qty 5

## 2020-11-02 MED ORDER — FENTANYL CITRATE (PF) 100 MCG/2ML IJ SOLN
INTRAMUSCULAR | Status: DC | PRN
  Administered 2020-11-02 (×2): 25 ug via INTRAVENOUS

## 2020-11-02 MED ORDER — POVIDONE-IODINE 10 % EX SWAB: 2.0000 "application " | Freq: Once | CUTANEOUS | Status: DC

## 2020-11-02 MED ORDER — LIDOCAINE-EPINEPHRINE 1 %-1:100000 IJ SOLN
INTRAMUSCULAR | Status: DC | PRN
  Administered 2020-11-02: 30 mL

## 2020-11-02 MED ORDER — PROPOFOL 10 MG/ML IV BOLUS
INTRAVENOUS | Status: DC | PRN
  Administered 2020-11-02: 200 mg via INTRAVENOUS

## 2020-11-02 MED ORDER — DEXAMETHASONE SODIUM PHOSPHATE 10 MG/ML IJ SOLN
INTRAMUSCULAR | Status: AC
  Filled 2020-11-02: qty 1

## 2020-11-02 MED ORDER — SODIUM CHLORIDE 0.9 % IR SOLN
Status: DC | PRN
  Administered 2020-11-02: 3000 mL

## 2020-11-02 MED ORDER — IBUPROFEN 600 MG PO TABS
600.0000 mg | ORAL_TABLET | Freq: Four times a day (QID) | ORAL | 1 refills | Status: DC | PRN
Start: 2020-11-02 — End: 2021-01-10

## 2020-11-02 MED ORDER — FENTANYL CITRATE (PF) 100 MCG/2ML IJ SOLN: 25.0000 ug | INTRAMUSCULAR | Status: DC | PRN

## 2020-11-02 SURGICAL SUPPLY — 16 items
CATH ROBINSON RED A/P 16FR (CATHETERS) ×2 IMPLANT
DEVICE MYOSURE LITE (MISCELLANEOUS) ×2 IMPLANT
DEVICE MYOSURE REACH (MISCELLANEOUS) IMPLANT
ELECT REM PT RETURN 9FT ADLT (ELECTROSURGICAL)
ELECTRODE REM PT RTRN 9FT ADLT (ELECTROSURGICAL) IMPLANT
GAUZE 4X4 16PLY ~~LOC~~+RFID DBL (SPONGE) ×2 IMPLANT
GLOVE SURG LTX SZ7 (GLOVE) ×2 IMPLANT
GLOVE SURG UNDER POLY LF SZ7 (GLOVE) ×4 IMPLANT
GOWN STRL REUS W/TWL LRG LVL3 (GOWN DISPOSABLE) ×4 IMPLANT
KIT PROCEDURE FLUENT (KITS) ×2 IMPLANT
PACK VAGINAL MINOR WOMEN LF (CUSTOM PROCEDURE TRAY) ×2 IMPLANT
PAD OB MATERNITY 4.3X12.25 (PERSONAL CARE ITEMS) ×2 IMPLANT
PAD PREP 24X48 CUFFED NSTRL (MISCELLANEOUS) ×2 IMPLANT
SEAL ROD LENS SCOPE MYOSURE (ABLATOR) ×2 IMPLANT
SLEEVE SCD COMPRESS KNEE MED (STOCKING) ×2 IMPLANT
TOWEL GREEN STERILE FF (TOWEL DISPOSABLE) ×4 IMPLANT

## 2020-11-02 NOTE — Anesthesia Postprocedure Evaluation (Signed)
Anesthesia Post Note  Patient: Danielle Reynolds  Procedure(s) Performed: DILATATION & CURETTAGE/HYSTEROSCOPY WITH MYOSURE (Uterus)     Patient location during evaluation: PACU Anesthesia Type: General Level of consciousness: awake and alert Pain management: pain level controlled Vital Signs Assessment: post-procedure vital signs reviewed and stable Respiratory status: spontaneous breathing, nonlabored ventilation and respiratory function stable Cardiovascular status: blood pressure returned to baseline and stable Postop Assessment: no apparent nausea or vomiting Anesthetic complications: no   No notable events documented.  Last Vitals:  Vitals:   11/02/20 1430 11/02/20 1442  BP: 127/81 (!) 134/97  Pulse: 98 92  Resp: 18 17  Temp:  36.7 C  SpO2: 100% 99%    Last Pain:  Vitals:   11/02/20 1442  TempSrc:   PainSc: 0-No pain                 Lowella Curb

## 2020-11-02 NOTE — Telephone Encounter (Signed)
Left message on VM informing patient of virtual postop appt with Dr. Shawnie Pons on 11/23/2020 at 1:45pm.  Patient was asked to contact our office with any questions or concerns.  Also, number to [id number] was given if the date and time does not work for her.

## 2020-11-02 NOTE — Anesthesia Procedure Notes (Signed)
Procedure Name: LMA Insertion Date/Time: 11/02/2020 1:27 PM Performed by: Lauralyn Primes, CRNA Pre-anesthesia Checklist: Patient identified, Emergency Drugs available, Suction available and Patient being monitored Patient Re-evaluated:Patient Re-evaluated prior to induction Oxygen Delivery Method: Circle system utilized Preoxygenation: Pre-oxygenation with 100% oxygen Induction Type: IV induction Ventilation: Mask ventilation without difficulty LMA: LMA inserted LMA Size: 4.0 Number of attempts: 1 Airway Equipment and Method: Bite block Placement Confirmation: positive ETCO2 Tube secured with: Tape Dental Injury: Teeth and Oropharynx as per pre-operative assessment

## 2020-11-02 NOTE — Interval H&P Note (Signed)
History and Physical Interval Note:  11/02/2020 12:53 PM  Danielle Reynolds  has presented today for surgery, with the diagnosis of Pelvic pain, Menorrhagia.  The various methods of treatment have been discussed with the patient and family. After consideration of risks, benefits and other options for treatment, the patient has consented to  Procedure(s): DILATATION & CURETTAGE/HYSTEROSCOPY WITH MYOSURE (N/A) as a surgical intervention.  The patient's history has been reviewed, patient examined, no change in status, stable for surgery.  I have reviewed the patient's chart and labs.  Questions were answered to the patient's satisfaction.     Reva Bores

## 2020-11-02 NOTE — Discharge Instructions (Addendum)
  May take NSAIDS (Ibuprofen, Motrin) after 8pm, if needed.   Post Anesthesia Home Care Instructions  Activity: Get plenty of rest for the remainder of the day. A responsible individual must stay with you for 24 hours following the procedure.  For the next 24 hours, DO NOT: -Drive a car -Advertising copywriter -Drink alcoholic beverages -Take any medication unless instructed by your physician -Make any legal decisions or sign important papers.  Meals: Start with liquid foods such as gelatin or soup. Progress to regular foods as tolerated. Avoid greasy, spicy, heavy foods. If nausea and/or vomiting occur, drink only clear liquids until the nausea and/or vomiting subsides. Call your physician if vomiting continues.  Special Instructions/Symptoms: Your throat may feel dry or sore from the anesthesia or the breathing tube placed in your throat during surgery. If this causes discomfort, gargle with warm salt water. The discomfort should disappear within 24 hours.

## 2020-11-02 NOTE — Telephone Encounter (Signed)
-----   Message from Reva Bores, MD sent at 11/02/2020  2:11 PM EDT ----- Book for post op check with me in 2-3 wks, can be virtual.

## 2020-11-02 NOTE — Anesthesia Preprocedure Evaluation (Addendum)
Anesthesia Evaluation  Patient identified by MRN, date of birth, ID band Patient awake    Reviewed: Allergy & Precautions, NPO status , Patient's Chart, lab work & pertinent test results  History of Anesthesia Complications Negative for: history of anesthetic complications  Airway Mallampati: II  TM Distance: >3 FB Neck ROM: Full    Dental no notable dental hx.    Pulmonary Current Smoker and Patient abstained from smoking.,    Pulmonary exam normal        Cardiovascular negative cardio ROS Normal cardiovascular exam     Neuro/Psych ADHD   GI/Hepatic negative GI ROS, Neg liver ROS,   Endo/Other  negative endocrine ROS  Renal/GU negative Renal ROS  negative genitourinary   Musculoskeletal negative musculoskeletal ROS (+)   Abdominal   Peds  Hematology negative hematology ROS (+)   Anesthesia Other Findings Day of surgery medications reviewed with patient.  Reproductive/Obstetrics Pelvic pain, Menorrhagia                            Anesthesia Physical Anesthesia Plan  ASA: 2  Anesthesia Plan: General   Post-op Pain Management:    Induction: Intravenous  PONV Risk Score and Plan: 4 or greater and Treatment may vary due to age or medical condition, Ondansetron, Dexamethasone, Midazolam and Scopolamine patch - Pre-op  Airway Management Planned: LMA  Additional Equipment: None  Intra-op Plan:   Post-operative Plan: Extubation in OR  Informed Consent: I have reviewed the patients History and Physical, chart, labs and discussed the procedure including the risks, benefits and alternatives for the proposed anesthesia with the patient or authorized representative who has indicated his/her understanding and acceptance.     Dental advisory given  Plan Discussed with: CRNA  Anesthesia Plan Comments:        Anesthesia Quick Evaluation

## 2020-11-02 NOTE — Op Note (Signed)
Preoperative diagnoses:  Menorrhagia, uterine mass  Postoperative diagnosis: Same  Procedure: D & C, hysteroscopy, Myosure resection of polyp  Surgeon: Shelbie Proctor. Shawnie Pons, MD  Anesthesia: Ladell Heads, MD LMA, paracervical block  Findings: endometrial polyp attached to the left side posteriorly  Fluid deficit: 265 cc  Estimated blood loss: Minimum  Pathology: Endometrial curettings  Indications for procedure: 35 y.o. G0P0000 with history of menorrhagia and u/s suggestive of polyp who presents for surgical diagnosis and treatment  Procedure: The patient was taken to the operating room where spinal analgesia was inserted. SCDs were in place.  Time out was performed. Patient was placed in dorsolithotomy in [place] stirrups. She was prepped and draped in the usual sterile fashion. A Red Rubber catheter was used to drain her bladder. A speculum was placeed in the vagina. The cervix was visualized anteriorly and grasped with a single-tooth tenaculum. Paracervical block was performed with 1% lidocaine plain with 20 cc injected. The uterus sounded to 8 cm. Sequential dilation was performed with Shawnie Pons dilators. The hysteroscope was inserted and the endometrial cavity and inspected. There were the above findings noted in the endometrial cavity with both ostia seen. The Myosure device used to remove the polyp. The hysteroscope was removed. Sharp curettage was performed in all 4 quadrants. All instruments were removed from the vagina. All instrument, needle and lap counts were correct x2. The patient was awakened and is recovering in stable condition.  Reva Bores MD 11/02/2020 2:04 PM

## 2020-11-02 NOTE — Transfer of Care (Signed)
Immediate Anesthesia Transfer of Care Note  Patient: Danielle Reynolds  Procedure(s) Performed: DILATATION & CURETTAGE/HYSTEROSCOPY WITH MYOSURE (Uterus)  Patient Location: PACU  Anesthesia Type:General  Level of Consciousness: awake, alert  and oriented  Airway & Oxygen Therapy: Patient Spontanous Breathing and Patient connected to face mask oxygen  Post-op Assessment: Report given to RN and Post -op Vital signs reviewed and stable  Post vital signs: Reviewed and stable  Last Vitals:  Vitals Value Taken Time  BP 124/85 11/02/20 1410  Temp    Pulse 124 11/02/20 1413  Resp 18 11/02/20 1413  SpO2 100 % 11/02/20 1413  Vitals shown include unvalidated device data.  Last Pain:  Vitals:   11/02/20 1200  TempSrc: Oral  PainSc: 0-No pain         Complications: No notable events documented.

## 2020-11-03 LAB — SURGICAL PATHOLOGY

## 2020-11-04 ENCOUNTER — Encounter (HOSPITAL_BASED_OUTPATIENT_CLINIC_OR_DEPARTMENT_OTHER): Payer: Self-pay | Admitting: Family Medicine

## 2020-11-23 ENCOUNTER — Other Ambulatory Visit: Payer: Self-pay

## 2020-11-23 ENCOUNTER — Telehealth (INDEPENDENT_AMBULATORY_CARE_PROVIDER_SITE_OTHER): Payer: Managed Care, Other (non HMO) | Admitting: Family Medicine

## 2020-11-23 DIAGNOSIS — Z09 Encounter for follow-up examination after completed treatment for conditions other than malignant neoplasm: Secondary | ICD-10-CM

## 2020-11-23 NOTE — Progress Notes (Signed)
    GYNECOLOGY VIRTUAL VISIT ENCOUNTER NOTE  Provider location: Center for [facility]   Patient location: Home  I connected with Danielle Reynolds on 11/23/20 at  1:45 PM EDT by MyChart Video Encounter and verified that I am speaking with the correct person using two identifiers.   I discussed the limitations, risks, security and privacy concerns of performing an evaluation and management service virtually and the availability of in person appointments. I also discussed with the patient that there may be a patient responsible charge related to this service. The patient expressed understanding and agreed to proceed.   History:  Danielle Reynolds is a 35 y.o. G0P0000 female being evaluated today for postop check after D and C with polyp and fibroid removal. She is feeling better, pain is improved and her cycle was light. She denies any abnormal vaginal discharge, bleeding, pelvic pain or other concerns.       Past Medical History:  Diagnosis Date   ADHD    Past Surgical History:  Procedure Laterality Date   DILATATION & CURETTAGE/HYSTEROSCOPY WITH MYOSURE N/A 11/02/2020   Procedure: DILATATION & CURETTAGE/HYSTEROSCOPY WITH MYOSURE;  Surgeon: Reva Bores, MD;  Location: [facility];  Service: Gynecology;  Laterality: N/A;   LAPAROSCOPIC APPENDECTOMY N/A 06/19/2019   Procedure: APPENDECTOMY LAPAROSCOPIC;  Surgeon: Romie Levee, MD;  Location: [facility];  Service: General;  Laterality: N/A;   The following portions of the patient's history were reviewed and updated as appropriate: allergies, current medications, past family history, past medical history, past social history, past surgical history and problem list.   Health Maintenance:  Normal pap and negative HRHPV on 09/05/2020.   Review of Systems:  Pertinent items noted in HPI and remainder of comprehensive ROS otherwise negative.  Physical Exam:   General:  Alert, oriented and cooperative. Patient appears to  be in no acute distress.  Mental Status: Normal mood and affect. Normal behavior. Normal judgment and thought content.   Respiratory: Normal respiratory effort, no problems with respiration noted  Rest of physical exam deferred due to type of encounter  Labs and Imaging No results found for this or any previous visit (from the past 336 hour(s)). No results found.     Assessment and Plan:     Postop check - doing well, reviewed path and images       I discussed the assessment and treatment plan with the patient. The patient was provided an opportunity to ask questions and all were answered. The patient agreed with the plan and demonstrated an understanding of the instructions.   The patient was advised to call back or seek an in-person evaluation/go to the ED if the symptoms worsen or if the condition fails to improve as anticipated.  I provided 12 minutes of face-to-face time during this encounter.   Reva Bores, MD Center for [employer], [facility]

## 2020-11-23 NOTE — Progress Notes (Signed)
I connected with  Danielle Reynolds on 11/23/20 at  1:45 PM EDT by telephone and verified that I am speaking with the correct person using two identifiers.   I discussed the limitations, risks, security and privacy concerns of performing an evaluation and management service by telephone and the availability of in person appointments. I also discussed with the patient that there may be a patient responsible charge related to this service. The patient expressed understanding and agreed to proceed.  Scheryl Marten, RN 11/23/2020  2:10 PM     Denies any issues

## 2021-01-10 ENCOUNTER — Other Ambulatory Visit: Payer: Self-pay

## 2021-01-10 ENCOUNTER — Emergency Department (HOSPITAL_BASED_OUTPATIENT_CLINIC_OR_DEPARTMENT_OTHER): Payer: Managed Care, Other (non HMO)

## 2021-01-10 ENCOUNTER — Emergency Department (HOSPITAL_BASED_OUTPATIENT_CLINIC_OR_DEPARTMENT_OTHER)
Admission: EM | Admit: 2021-01-10 | Discharge: 2021-01-10 | Disposition: A | Discharge status: Home / Self Care | Payer: Managed Care, Other (non HMO) | Attending: Emergency Medicine | Admitting: Emergency Medicine

## 2021-01-10 ENCOUNTER — Encounter (HOSPITAL_BASED_OUTPATIENT_CLINIC_OR_DEPARTMENT_OTHER): Payer: Self-pay | Admitting: *Deleted

## 2021-01-10 DIAGNOSIS — M5432 Sciatica, left side: Secondary | ICD-10-CM | POA: Insufficient documentation

## 2021-01-10 DIAGNOSIS — F17210 Nicotine dependence, cigarettes, uncomplicated: Secondary | ICD-10-CM | POA: Insufficient documentation

## 2021-01-10 DIAGNOSIS — R110 Nausea: Secondary | ICD-10-CM | POA: Insufficient documentation

## 2021-01-10 DIAGNOSIS — R1032 Left lower quadrant pain: Secondary | ICD-10-CM | POA: Diagnosis present

## 2021-01-10 LAB — BASIC METABOLIC PANEL
Anion gap: 9 (ref 5–15)
BUN: 8 mg/dL (ref 6–20)
CO2: 25 mmol/L (ref 22–32)
Calcium: 9.2 mg/dL (ref 8.9–10.3)
Chloride: 103 mmol/L (ref 98–111)
Creatinine, Ser: 0.82 mg/dL (ref 0.44–1.00)
GFR, Estimated: >60 mL/min (ref >60)
Glucose, Bld: 100 mg/dL — ABNORMAL HIGH (ref 70–99)
Potassium: 3.4 mmol/L — ABNORMAL LOW (ref 3.5–5.1)
Sodium: 137 mmol/L (ref 135–145)

## 2021-01-10 LAB — PREGNANCY, URINE: Preg Test, Ur: NEGATIVE

## 2021-01-10 LAB — URINALYSIS, ROUTINE W REFLEX MICROSCOPIC
Bilirubin Urine: NEGATIVE
Glucose, UA: NEGATIVE mg/dL
Hgb urine dipstick: NEGATIVE
Ketones, ur: NEGATIVE mg/dL
Leukocytes,Ua: NEGATIVE
Nitrite: NEGATIVE
Protein, ur: NEGATIVE mg/dL
Specific Gravity, Urine: 1.015 (ref 1.005–1.030)
pH: 7 (ref 5.0–8.0)

## 2021-01-10 LAB — CBC WITH DIFFERENTIAL/PLATELET
Abs Immature Granulocytes: 0.02 10*3/uL (ref 0.00–0.07)
Basophils Absolute: 0 10*3/uL (ref 0.0–0.1)
Basophils Relative: 0 %
Eosinophils Absolute: 0.1 10*3/uL (ref 0.0–0.5)
Eosinophils Relative: 1 %
HCT: 43.5 % (ref 36.0–46.0)
Hemoglobin: 15 g/dL (ref 12.0–15.0)
Immature Granulocytes: 0 %
Lymphocytes Relative: 29 %
Lymphs Abs: 2.7 10*3/uL (ref 0.7–4.0)
MCH: 30.7 pg (ref 26.0–34.0)
MCHC: 34.5 g/dL (ref 30.0–36.0)
MCV: 89 fL (ref 80.0–100.0)
Monocytes Absolute: 0.6 10*3/uL (ref 0.1–1.0)
Monocytes Relative: 6 %
Neutro Abs: 5.9 10*3/uL (ref 1.7–7.7)
Neutrophils Relative %: 64 %
Platelets: 258 10*3/uL (ref 150–400)
RBC: 4.89 MIL/uL (ref 3.87–5.11)
RDW: 12.7 % (ref 11.5–15.5)
WBC: 9.4 10*3/uL (ref 4.0–10.5)
nRBC: 0 % (ref 0.0–0.2)

## 2021-01-10 MED ORDER — METHOCARBAMOL 500 MG PO TABS: 500.0000 mg | ORAL_TABLET | Freq: Two times a day (BID) | ORAL | 0 refills | Status: AC

## 2021-01-10 MED ORDER — METHOCARBAMOL 500 MG PO TABS
500.0000 mg | ORAL_TABLET | Freq: Once | ORAL | Status: AC
  Administered 2021-01-10: 500 mg via ORAL
  Filled 2021-01-10: qty 1

## 2021-01-10 MED ORDER — IBUPROFEN 800 MG PO TABS
800.0000 mg | ORAL_TABLET | Freq: Once | ORAL | Status: AC
  Administered 2021-01-10: 800 mg via ORAL
  Filled 2021-01-10: qty 1

## 2021-01-10 MED ORDER — PREDNISONE 10 MG (21) PO TBPK: ORAL_TABLET | ORAL | 0 refills | Status: AC

## 2021-01-10 MED ORDER — IBUPROFEN 600 MG PO TABS: 600.0000 mg | ORAL_TABLET | Freq: Four times a day (QID) | ORAL | 1 refills | Status: AC | PRN

## 2021-01-10 NOTE — ED Notes (Signed)
Patient transported to CT 

## 2021-01-10 NOTE — ED Triage Notes (Signed)
C/o sudden onset of flank pain that radiated around to left abd x 8 hrs

## 2021-01-10 NOTE — ED Provider Notes (Signed)
[facility] EMERGENCY DEPARTMENT Provider Note  CSN: [id number] Arrival date & time: 01/10/21 1840    History Chief Complaint  Patient presents with   Flank Pain    Danielle Reynolds is a 35 y.o. female with no significant PMH reports onset of L flank pain this morning when she woke up. Has been persistent though waxing and waning through the day, now also radiating to L groin, associated with nausea. She has not had any fever, dysuria or hematuria. No prior history of kidney stones.    Past Medical History:  Diagnosis Date   ADHD     Past Surgical History:  Procedure Laterality Date   DILATATION & CURETTAGE/HYSTEROSCOPY WITH MYOSURE N/A 11/02/2020   Procedure: DILATATION & CURETTAGE/HYSTEROSCOPY WITH MYOSURE;  Surgeon: Reva Bores, MD;  Location: [facility];  Service: Gynecology;  Laterality: N/A;   LAPAROSCOPIC APPENDECTOMY N/A 06/19/2019   Procedure: APPENDECTOMY LAPAROSCOPIC;  Surgeon: Romie Levee, MD;  Location: [facility];  Service: General;  Laterality: N/A;    Family History  Problem Relation Age of Onset   Healthy Mother    Hypertension Father     Social History   Tobacco Use   Smoking status: Some Days    Types: Cigarettes   Smokeless tobacco: Never  Vaping Use   Vaping Use: Never used  Substance Use Topics   Alcohol use: Yes    Comment: occ   Drug use: Never     Home Medications Prior to Admission medications   Medication Sig Start Date End Date Taking? Authorizing Provider  methocarbamol (ROBAXIN) 500 MG tablet Take 1 tablet (500 mg total) by mouth 2 (two) times daily. 01/10/21  Yes Pollyann Savoy, MD  predniSONE (STERAPRED UNI-PAK 21 TAB) 10 MG (21) TBPK tablet 10mg  Tabs, 6 day taper. Use as directed 01/10/21  Yes 01/12/21, MD  ibuprofen (ADVIL) 600 MG tablet Take 1 tablet (600 mg total) by mouth every 6 (six) hours as needed for moderate pain. 01/10/21   01/12/21, MD  VYVANSE 70 MG capsule Take 70 mg by  mouth every morning. 06/01/19   [provider]     Allergies    Codeine   Review of Systems   Review of Systems A comprehensive review of systems was completed and negative except as noted in HPI.    Physical Exam BP (!) 138/92 (BP Location: Right Arm)   Pulse (!) 116   Temp 98.3 F (36.8 C) (Oral)   Resp 16   Ht 5\' 9"  (1.753 m)   Wt 80.7 kg   LMP 12/28/2020   SpO2 100%   BMI 26.29 kg/m   Physical Exam Vitals and nursing note reviewed.  Constitutional:      Appearance: Normal appearance.     Comments: uncomfortable  HENT:     Head: Normocephalic and atraumatic.     Nose: Nose normal.     Mouth/Throat:     Mouth: Mucous membranes are moist.  Eyes:     Extraocular Movements: Extraocular movements intact.     Conjunctiva/sclera: Conjunctivae normal.  Cardiovascular:     Rate and Rhythm: Normal rate.  Pulmonary:     Effort: Pulmonary effort is normal.     Breath sounds: Normal breath sounds.  Abdominal:     General: Abdomen is flat.     Palpations: Abdomen is soft.     Tenderness: There is no abdominal tenderness. There is no guarding.  Musculoskeletal:  General: No swelling. Normal range of motion.     Cervical back: Neck supple.  Skin:    General: Skin is warm and dry.  Neurological:     General: No focal deficit present.     Mental Status: She is alert.  Psychiatric:        Mood and Affect: Mood normal.     ED Results / Procedures / Treatments   Labs (all labs ordered are listed, but only abnormal results are displayed) Labs Reviewed  BASIC METABOLIC PANEL - Abnormal; Notable for the following components:      Result Value   Potassium 3.4 (*)    Glucose, Bld 100 (*)    All other components within normal limits  PREGNANCY, URINE  URINALYSIS, ROUTINE W REFLEX MICROSCOPIC  CBC WITH DIFFERENTIAL/PLATELET    EKG None   Radiology CT Renal Stone Study  Result Date: 01/10/2021 CLINICAL DATA:  C/o sudden onset of flank pain that  radiated around to left abd x 8 hrs, h/o APPY. Flank pain, kidney stone suspected EXAM: CT ABDOMEN AND PELVIS WITHOUT CONTRAST TECHNIQUE: Multidetector CT imaging of the abdomen and pelvis was performed following the standard protocol without IV contrast. COMPARISON:  None. FINDINGS: Lower chest: No acute abnormality. Hepatobiliary: No focal liver abnormality. No gallstones, gallbladder wall thickening, or pericholecystic fluid. No biliary dilatation. Pancreas: No focal lesion. Normal pancreatic contour. No surrounding inflammatory changes. No main pancreatic ductal dilatation. Spleen: Normal in size without focal abnormality. A splenule is noted. Adrenals/Urinary Tract: No adrenal nodule bilaterally. No nephrolithiasis and no hydronephrosis. No definite contour-deforming renal mass. No ureterolithiasis or hydroureter. The urinary bladder is unremarkable. Stomach/Bowel: Stomach is within normal limits. No evidence of bowel wall thickening or dilatation. Status post appendectomy Vascular/Lymphatic: No abdominal aorta or iliac aneurysm.No abdominal, pelvic, or inguinal lymphadenopathy. Reproductive: Uterus and bilateral adnexa are unremarkable. Other: No intraperitoneal free fluid. No intraperitoneal free gas. No organized fluid collection. Musculoskeletal: Tiny fat containing umbilical hernia. No suspicious lytic or blastic osseous lesions. No acute displaced fracture. IMPRESSION: No acute intra-abdominal or intrapelvic abnormality with limited evaluation on this noncontrast study. Electronically Signed   By: Tish Frederickson M.D.   On: 01/10/2021 19:43    Procedures Procedures  Medications Ordered in the ED Medications  methocarbamol (ROBAXIN) tablet 500 mg (has no administration in time range)  ibuprofen (ADVIL) tablet 800 mg (800 mg Oral Given 01/10/21 1932)     MDM Rules/Calculators/A&P MDM  Patient with symptoms consistent with ureteral colic. UA neg for infection. She declines pain medications and  requests Motrin instead. Check labs and send for CT.  ED Course  I have reviewed the triage vital signs and the nursing notes.  Pertinent labs & imaging results that were available during my care of the patient were reviewed by me and considered in my medical decision making (see chart for details).  Clinical Course as of 01/10/21 2035  Tue Jan 10, 2021  4128 CT is neg for renal stone. CBC is normal.  [CS]  2027 BMP is normal. Suspect this is MSK vs sciatica. She reports some improvement with Motrin. Will treat with NSAID, prednisone and Robaxin Rx, PCP follow up. RTED for any other concerns. [CS]    Clinical Course User Index [CS] Pollyann Savoy, MD    Final Clinical Impression(s) / ED Diagnoses Final diagnoses:  Left sided sciatica    Rx / DC Orders ED Discharge Orders          Ordered  ibuprofen (ADVIL) 600 MG tablet  Every 6 hours PRN        01/10/21 2035    methocarbamol (ROBAXIN) 500 MG tablet  2 times daily        01/10/21 2035    predniSONE (STERAPRED UNI-PAK 21 TAB) 10 MG (21) TBPK tablet        01/10/21 2035             Pollyann Savoy, MD 01/10/21 2035

## 2021-07-12 ENCOUNTER — Encounter: Payer: Self-pay | Admitting: General Practice

## 2021-12-13 IMAGING — US US PELVIS COMPLETE TRANSABD/TRANSVAG W DUPLEX
1 series · 13 of 25 positions shown · non-contrast
Comparison: CT 06/19/2019

CLINICAL DATA: Left lower quadrant pain

EXAM:
TRANSABDOMINAL AND TRANSVAGINAL ULTRASOUND OF PELVIS
DOPPLER ULTRASOUND OF OVARIES
TECHNIQUE: Both transabdominal and transvaginal ultrasound examinations of the
pelvis were performed. Transabdominal technique was performed for
global imaging of the pelvis including uterus, ovaries, adnexal
regions, and pelvic cul-de-sac.
It was necessary to proceed with endovaginal exam following the
transabdominal exam to visualize the uterus endometrium ovaries.
Color and duplex Doppler ultrasound was utilized to evaluate blood
flow to the ovaries.

[Series 1: us pelvis complete transabd/transvag w duplex · 13 of 67 slices shown]
[im 1/67]
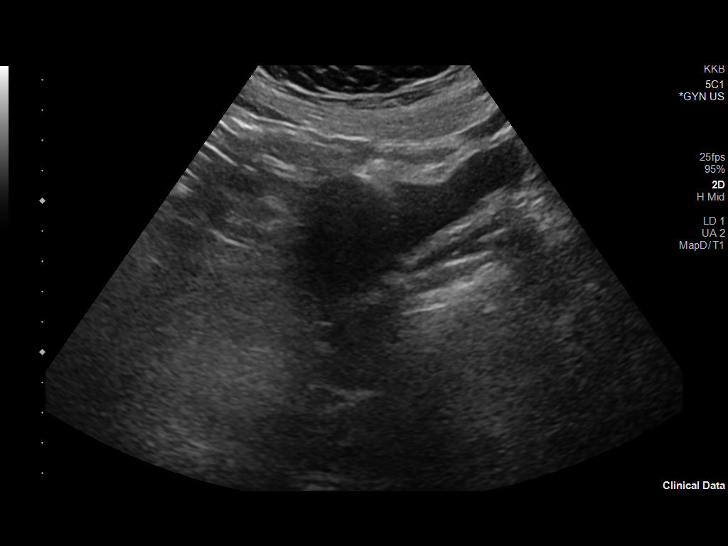
[im 6/67]
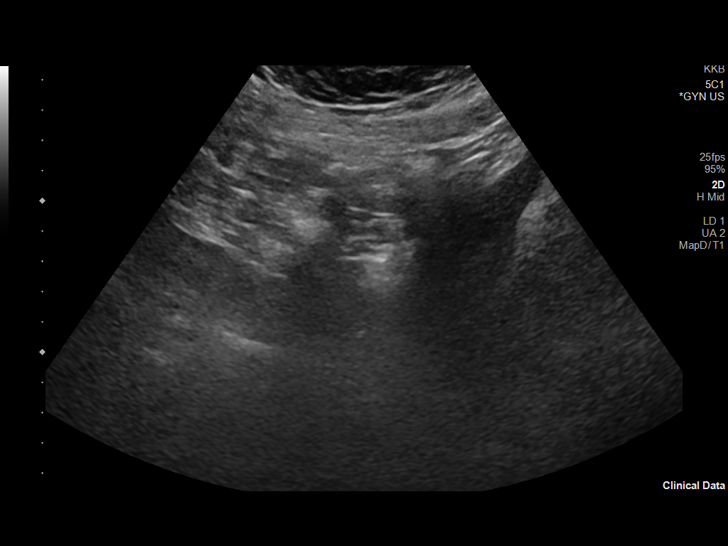
[im 12/67]
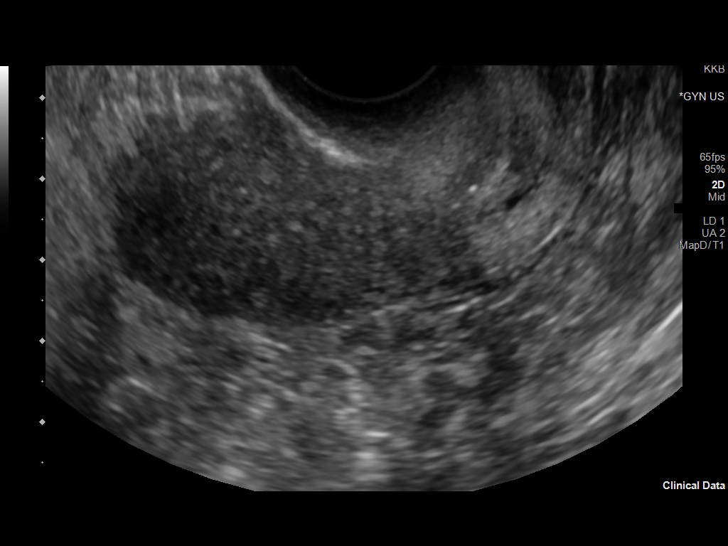
[im 17/67]
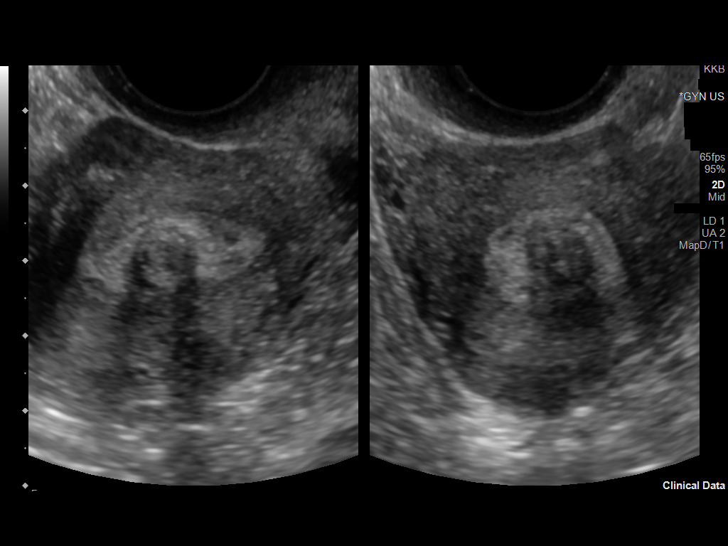
[im 23/67]
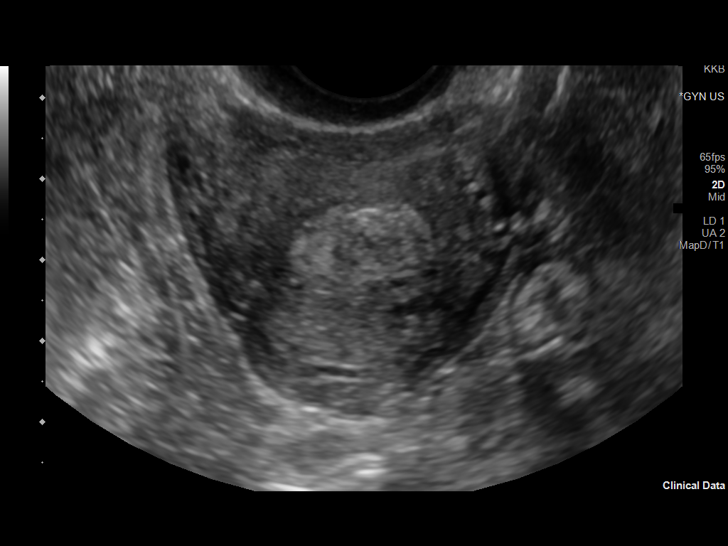
[im 28/67]
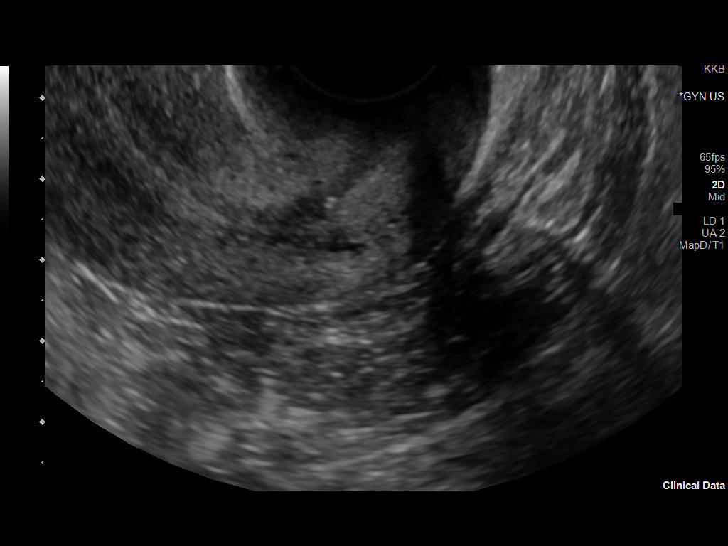
[im 34/67]
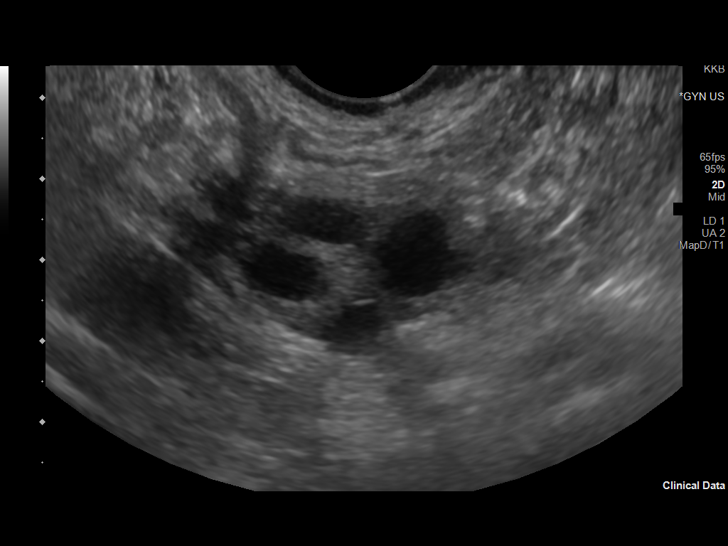
[im 39/67]
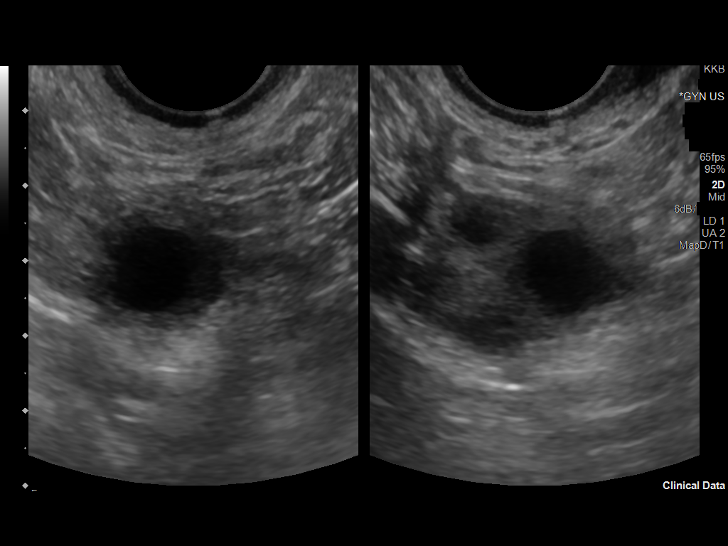
[im 45/67]
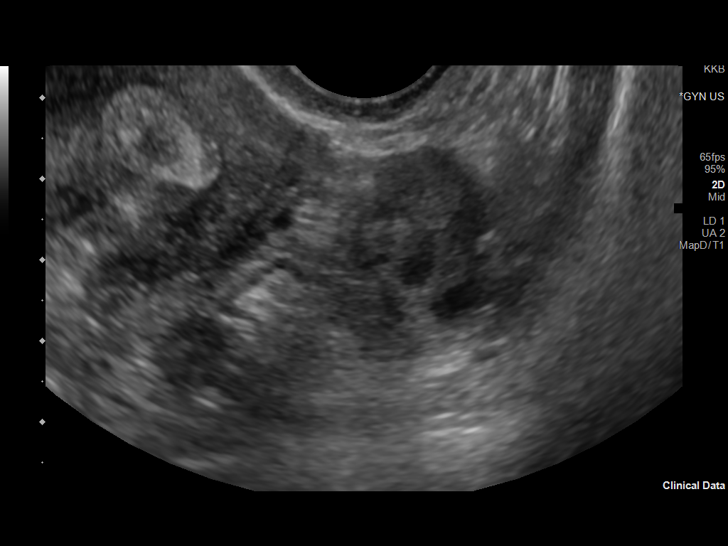
[im 50/67]
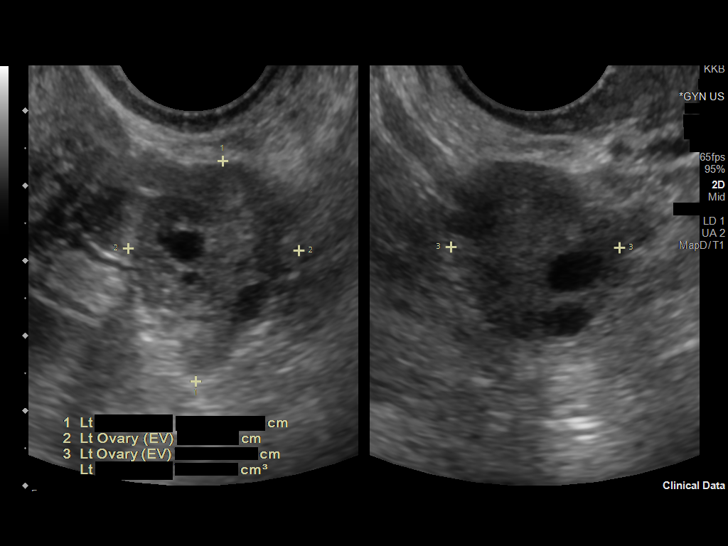
[im 56/67]
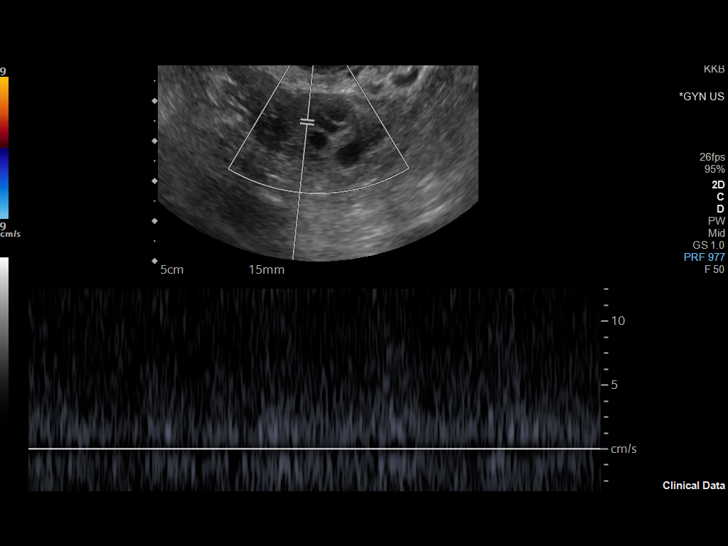
[im 61/67]
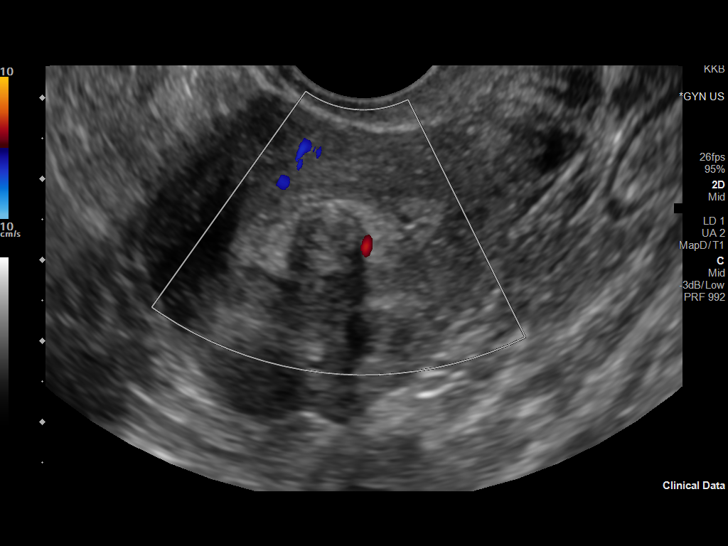
[im 67/67]
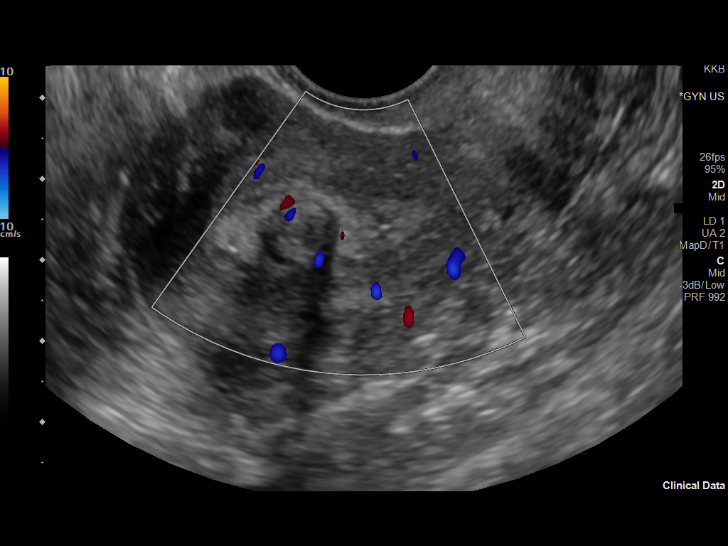

[13 of 25 positions shown; findings below may reference images not displayed]

FINDINGS: Uterus

Measurements: 6.4 x 4.1 x 4.2 cm = volume: 58.3 mL. Hypoechoic
subserosal posterior fundal mass measuring 1.4 x 1.2 x 1.3 cm
consistent with fibroid.

Endometrium

Thickness: 11 mm. Focal hypoechoic mass measuring 1.1 x 0.9 x 1 cm
within the fundal endometrium.

Right ovary

Measurements: 2.8 x 2.2 x 2.7 cm = volume: 8.7 mL. Normal
appearance/no adnexal mass.

Left ovary

Measurements: 3 x 2.3 x 2.3 cm = volume: 7.9 mL. Normal
appearance/no adnexal mass.

Pulsed Doppler evaluation of both ovaries demonstrates normal
low-resistance arterial and venous waveforms.

Other findings

No abnormal free fluid.
IMPRESSION: 1. Negative for ovarian torsion.
2. 11 mm hypoechoic mass within the fundal endometrium. A focal
endometrial lesion is suspected. Consider sonohysterogram for
further evaluation, prior to hysteroscopy or endometrial biopsy.
3. Small uterine fibroid

## 2022-05-07 IMAGING — CT CT RENAL STONE PROTOCOL
2 of 4 series · 16 of 46 positions shown, 18 images · non-contrast
Comparison: None.

CLINICAL DATA: C/o sudden onset of flank pain that radiated around
to left abd x 8 hrs, h/o APPY. Flank pain, kidney stone suspected

EXAM:
CT ABDOMEN AND PELVIS WITHOUT CONTRAST
TECHNIQUE: Multidetector CT imaging of the abdomen and pelvis was performed
following the standard protocol without IV contrast.

[Series 2: axial st · axial · 0.79mm/px · z∈[+817,+1242]mm · 13 of 93 slices shown, 15 images]
[im 4/93  soft-tissue]
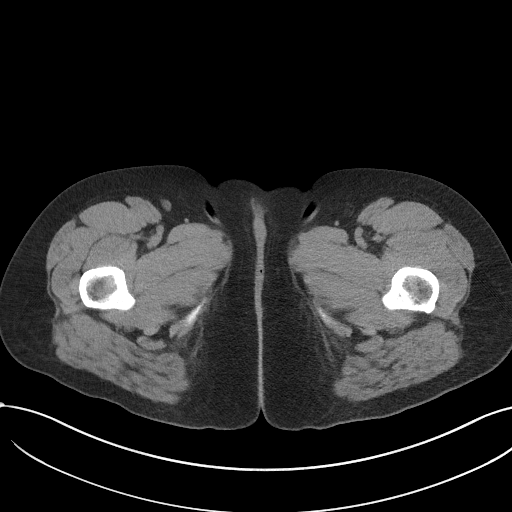
[im 4/93  bone]
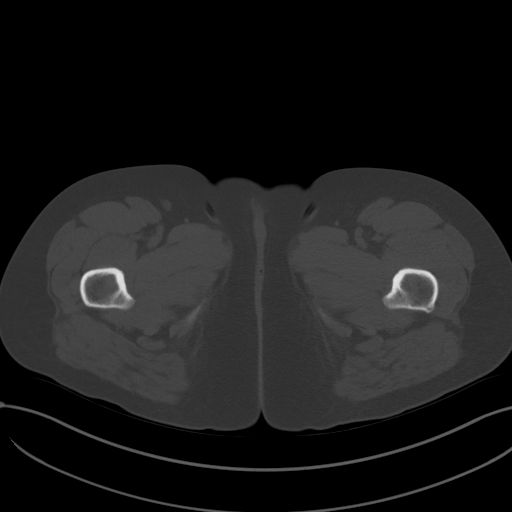
[im 12/93  soft-tissue]
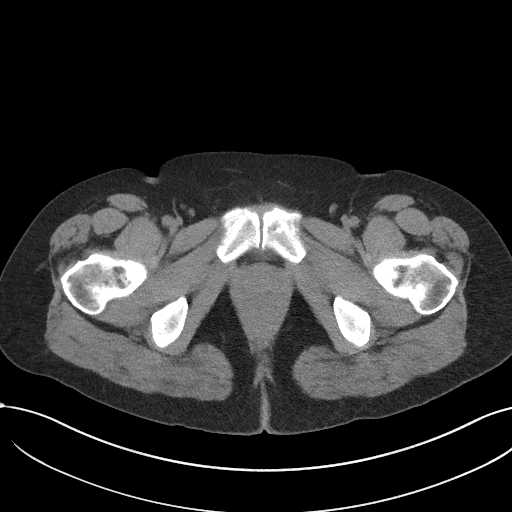
[im 19/93  soft-tissue]
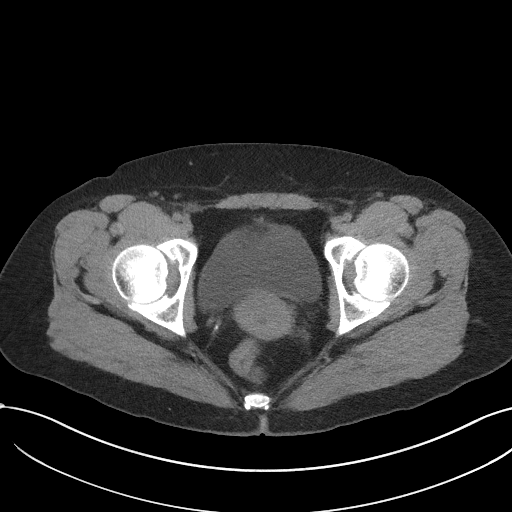
[im 26/93  soft-tissue]
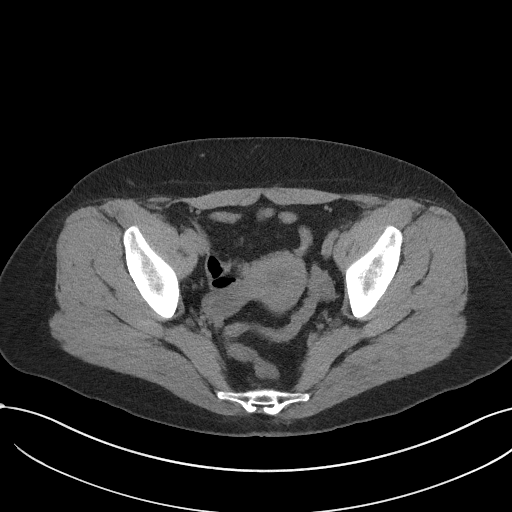
[im 34/93  soft-tissue]
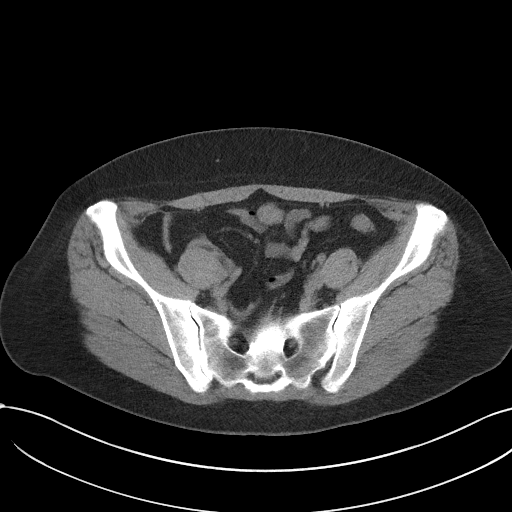
[im 41/93  soft-tissue]
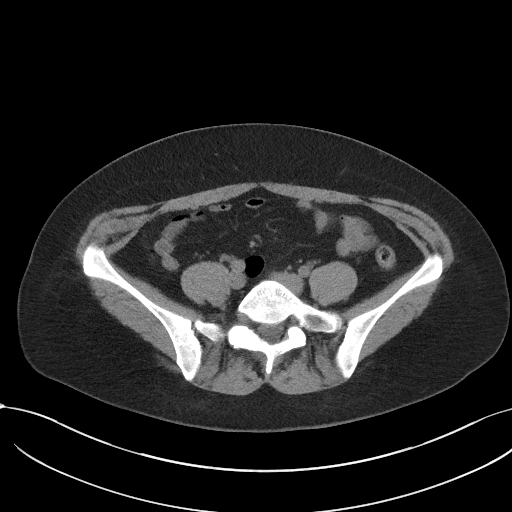
[im 48/93  soft-tissue]
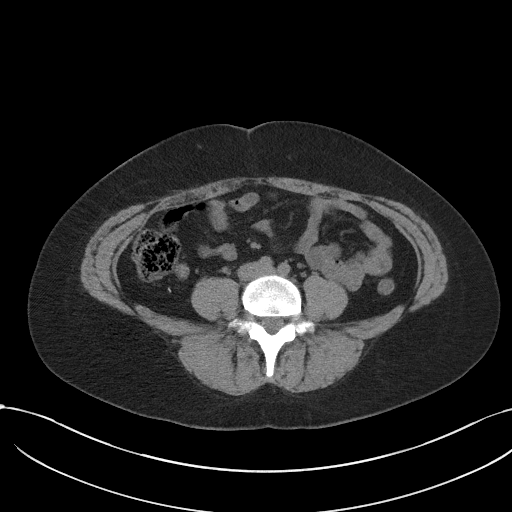
[im 52/93  soft-tissue]
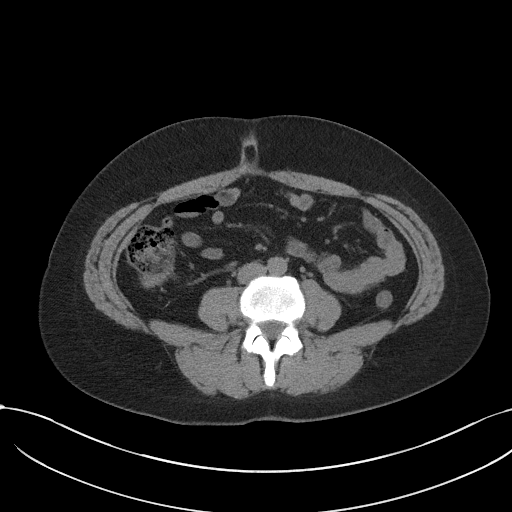
[im 59/93  soft-tissue]
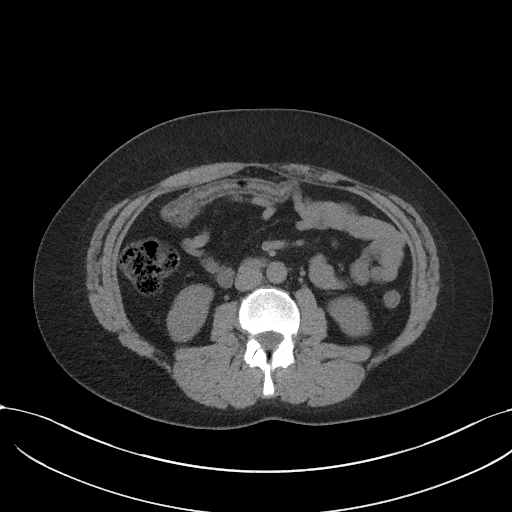
[im 59/93  bone]
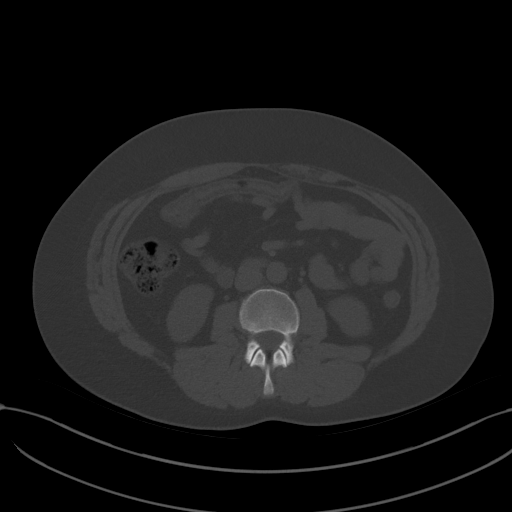
[im 67/93  soft-tissue]
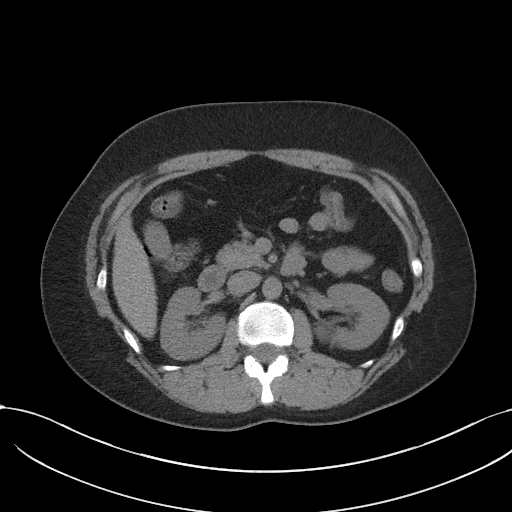
[im 74/93  soft-tissue]
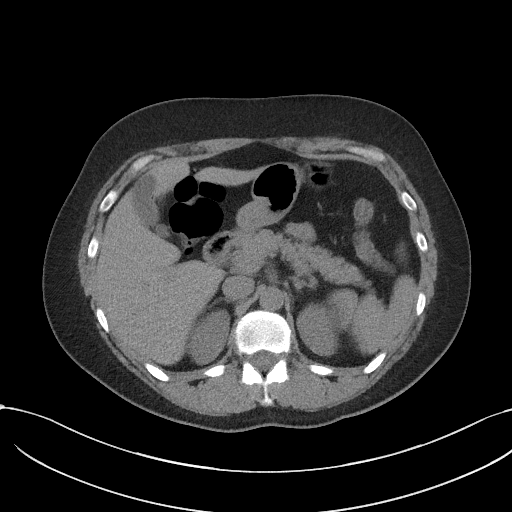
[im 81/93  soft-tissue]
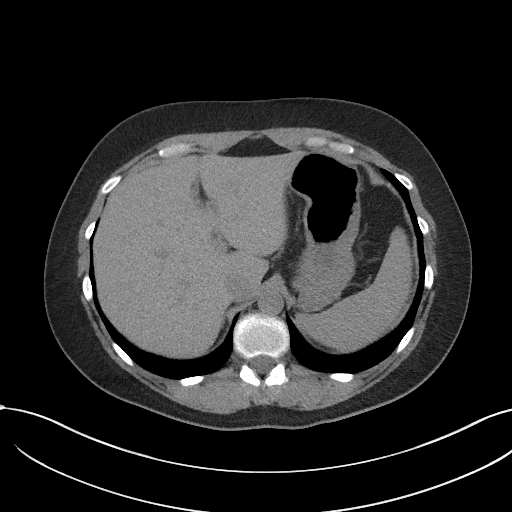
[im 89/93  soft-tissue]
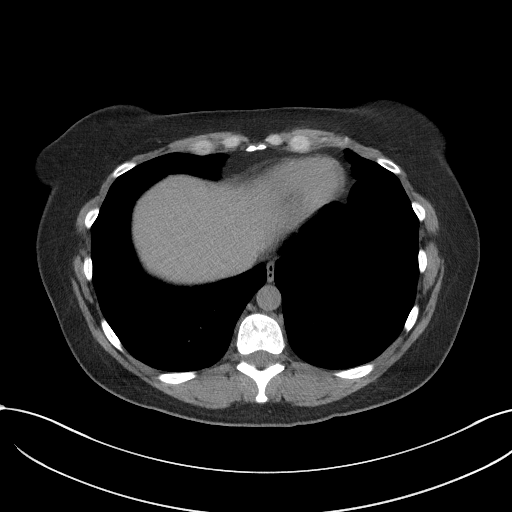

[Series 4: coronal st · coronal · 0.73mm/px · 3 of 81 slices shown]
[im 27/81  soft-tissue]
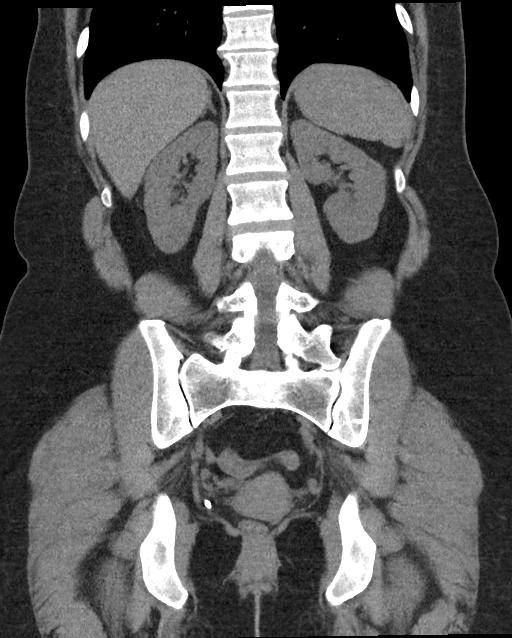
[im 36/81  soft-tissue]
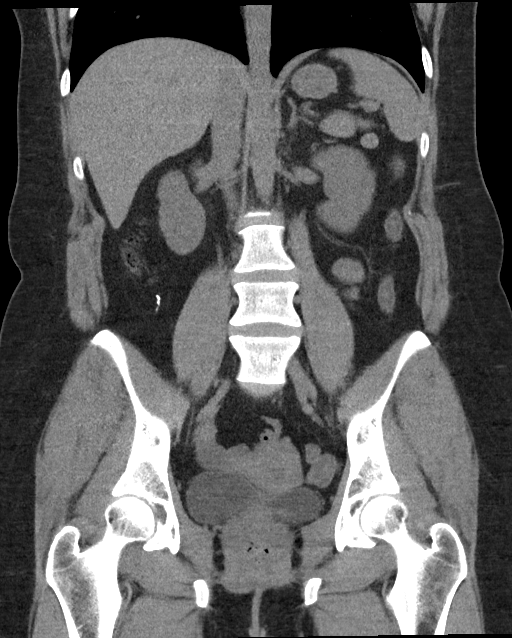
[im 45/81  soft-tissue]
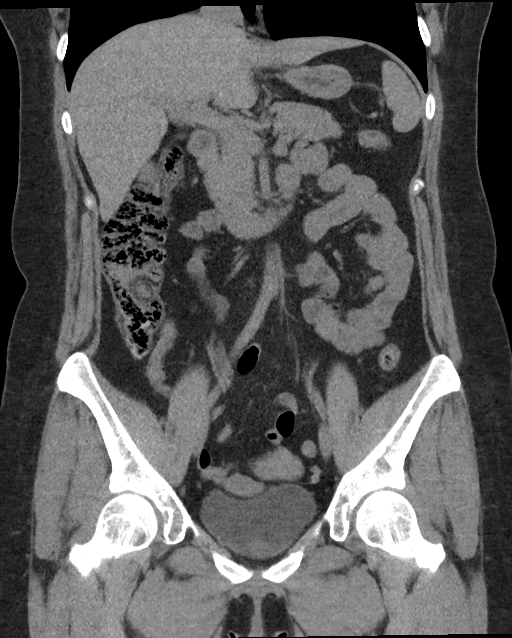

[16 of 46 positions shown; findings below may reference images not displayed]

FINDINGS: Lower chest: No acute abnormality.

Hepatobiliary: No focal liver abnormality. No gallstones,
gallbladder wall thickening, or pericholecystic fluid. No biliary
dilatation.

Pancreas: No focal lesion. Normal pancreatic contour. No surrounding
inflammatory changes. No main pancreatic ductal dilatation.

Spleen: Normal in size without focal abnormality. A splenule is
noted.

Adrenals/Urinary Tract:

No adrenal nodule bilaterally.

No nephrolithiasis and no hydronephrosis. No definite
contour-deforming renal mass.

No ureterolithiasis or hydroureter.

The urinary bladder is unremarkable.

Stomach/Bowel: Stomach is within normal limits. No evidence of bowel
wall thickening or dilatation. Status post appendectomy

Vascular/Lymphatic: No abdominal aorta or iliac aneurysm.No
abdominal, pelvic, or inguinal lymphadenopathy.

Reproductive: Uterus and bilateral adnexa are unremarkable.

Other: No intraperitoneal free fluid. No intraperitoneal free gas.
No organized fluid collection.

Musculoskeletal:

Tiny fat containing umbilical hernia.

No suspicious lytic or blastic osseous lesions. No acute displaced
fracture.
IMPRESSION: No acute intra-abdominal or intrapelvic abnormality with limited
evaluation on this noncontrast study.

## 2024-03-27 ENCOUNTER — Other Ambulatory Visit: Payer: Self-pay

## 2024-03-27 ENCOUNTER — Ambulatory Visit
Admission: RE | Admit: 2024-03-27 | Discharge: 2024-03-27 | Disposition: A | Discharge status: Home / Self Care | Attending: Physician Assistant | Admitting: Physician Assistant

## 2024-03-27 VITALS — BP 125/88 | HR 99 | Temp 98.3°F | Resp 16 | Ht 69.0 in | Wt 167.0 lb

## 2024-03-27 DIAGNOSIS — N750 Cyst of Bartholin's gland: Secondary | ICD-10-CM

## 2024-03-27 MED ORDER — SULFAMETHOXAZOLE-TRIMETHOPRIM 800-160 MG PO TABS: 1.0000 | ORAL_TABLET | Freq: Two times a day (BID) | ORAL | 0 refills | Status: AC

## 2024-03-27 MED ORDER — FLUCONAZOLE 150 MG PO TABS: 150.0000 mg | ORAL_TABLET | ORAL | 0 refills | Status: AC | PRN

## 2024-03-27 NOTE — ED Triage Notes (Addendum)
 Pt presents with a chief complaint of infected Bartholin cyst on right labia. States it has been there for approximately 8-10 years. Believes the cyst has become infected. Swelling + area is hard. Rates overall pain a 4/10. Describes as sore and tender. OTC Ibuprofen  taken at home with some improvement/pain relief.

## 2024-03-27 NOTE — ED Provider Notes (Signed)
 [facility]    CSN: [id number] Arrival date & time: 03/27/24  1117      History   Chief Complaint Chief Complaint  Patient presents with   Abscess    I have an infected bartholin cyst - Entered by patient    HPI Danielle Reynolds is a 38 y.o. female.  has a past medical history of ADHD.   HPI  Discussed the use of AI scribe software for clinical note transcription with the patient, who gave verbal consent to proceed.  The patient presents with an infected Bartholin gland cyst.  The patient has been experiencing recurrent episodes of an infected Bartholin cyst, with the most recent episode occurring in January. The current episode has been aggravated for the past two to three days, with pain localized to the right side, rated as four out of ten, affecting her ability to sit and walk comfortably.  In the past, the cyst was drained and packed, but there was a miscommunication regarding the duration the packing should remain in place, leading to its removal after two days. Currently, there is no drainage or bleeding from the cyst.     Past Medical History:  Diagnosis Date   ADHD     Patient Active Problem List   Diagnosis Date Noted   Uterine mass 10/24/2020   Pelvic pain 09/05/2020   Menorrhagia with regular cycle 09/05/2020    Past Surgical History:  Procedure Laterality Date   DILATATION & CURETTAGE/HYSTEROSCOPY WITH MYOSURE N/A 11/02/2020   Procedure: DILATATION & CURETTAGE/HYSTEROSCOPY WITH MYOSURE;  Surgeon: Fredirick Glenys RAMAN, MD;  Location: [facility];  Service: Gynecology;  Laterality: N/A;   LAPAROSCOPIC APPENDECTOMY N/A 06/19/2019   Procedure: APPENDECTOMY LAPAROSCOPIC;  Surgeon: Debby Hila, MD;  Location: [facility];  Service: General;  Laterality: N/A;    OB History     Gravida  0   Para  0   Term  0   Preterm  0   AB  0   Living  0      SAB  0   IAB  0   Ectopic  0   Multiple  0   Live Births  0             Home Medications    Prior to Admission medications   Medication Sig Start Date End Date Taking? Authorizing Provider  fluconazole  (DIFLUCAN ) 150 MG tablet Take 1 tablet (150 mg total) by mouth every three (3) days as needed. May repeat in 3 days if symptoms not resolved 03/27/24  Yes Demaria Deeney E, PA-C  sulfamethoxazole -trimethoprim  (BACTRIM  DS) 800-160 MG tablet Take 1 tablet by mouth 2 (two) times daily for 7 days. 03/27/24 04/03/24 Yes Berl Bonfanti E, PA-C  ibuprofen  (ADVIL ) 600 MG tablet Take 1 tablet (600 mg total) by mouth every 6 (six) hours as needed for moderate pain. 01/10/21   Roselyn Carlin NOVAK, MD  methocarbamol  (ROBAXIN ) 500 MG tablet Take 1 tablet (500 mg total) by mouth 2 (two) times daily. 01/10/21   Roselyn Carlin NOVAK, MD  predniSONE  (STERAPRED UNI-PAK 21 TAB) 10 MG (21) TBPK tablet 10mg  Tabs, 6 day taper. Use as directed 01/10/21   Roselyn Carlin NOVAK, MD  VYVANSE  70 MG capsule Take 70 mg by mouth every morning. 06/01/19   [provider]    Family History Family History  Problem Relation Age of Onset   Healthy Mother    Hypertension Father     Social History Social History  Tobacco Use   Smoking status: Some Days    Types: Cigarettes   Smokeless tobacco: Never  Vaping Use   Vaping status: Never Used  Substance Use Topics   Alcohol use: Yes    Comment: occ   Drug use: Never     Allergies   Codeine   Review of Systems Review of Systems  Genitourinary:        Concern for Bartholin gland cyst/abscess      Physical Exam Triage Vital Signs ED Triage Vitals  Encounter Vitals Group     BP 03/27/24 1123 (!) 146/91     Girls Systolic BP Percentile --      Girls Diastolic BP Percentile --      Boys Systolic BP Percentile --      Boys Diastolic BP Percentile --      Pulse Rate 03/27/24 1123 (!) 114     Resp 03/27/24 1123 16     Temp 03/27/24 1123 98.3 F (36.8 C)     Temp Source 03/27/24 1123 Oral     SpO2 03/27/24 1123 99 %     Weight  03/27/24 1123 167 lb (75.8 kg)     Height 03/27/24 1123 5' 9 (1.753 m)     Head Circumference --      Peak Flow --      Pain Score 03/27/24 1140 4     Pain Loc --      Pain Education --      Exclude from Growth Chart --    No data found.  Updated Vital Signs BP 125/88 (BP Location: Right Arm)   Pulse 99   Temp 98.3 F (36.8 C) (Oral)   Resp 16   Ht 5' 9 (1.753 m)   Wt 167 lb (75.8 kg)   LMP 03/13/2024 (Exact Date)   SpO2 99%   BMI 24.66 kg/m   Visual Acuity Right Eye Distance:   Left Eye Distance:   Bilateral Distance:    Right Eye Near:   Left Eye Near:    Bilateral Near:     Physical Exam Vitals reviewed. Exam conducted with a chaperone present.  Constitutional:      General: She is awake. She is not in acute distress.    Appearance: Normal appearance. She is well-developed and well-groomed. She is not ill-appearing, toxic-appearing or diaphoretic.  HENT:     Head: Normocephalic and atraumatic.  Eyes:     General: Lids are normal. Gaze aligned appropriately.     Extraocular Movements: Extraocular movements intact.     Conjunctiva/sclera: Conjunctivae normal.  Pulmonary:     Effort: Pulmonary effort is normal.  Genitourinary:    Tanner stage (genital): 5.     Labia:        Right: Tenderness present.       Comments: Chaperoned by Rosina Sharps, RN Neurological:     Mental Status: She is alert and oriented to person, place, and time.  Psychiatric:        Attention and Perception: Attention and perception normal.        Mood and Affect: Mood and affect normal.        Speech: Speech normal.        Behavior: Behavior normal. Behavior is cooperative.        Thought Content: Thought content normal.        Judgment: Judgment normal.      UC Treatments / Results  Labs (all labs ordered are listed, but only  abnormal results are displayed) Labs Reviewed - No data to display  EKG   Radiology No results found.  Procedures Procedures (including  critical care time)  Medications Ordered in UC Medications - No data to display  Initial Impression / Assessment and Plan / UC Course  I have reviewed the triage vital signs and the nursing notes.  Pertinent labs & imaging results that were available during my care of the patient were reviewed by me and considered in my medical decision making (see chart for details).      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 No drainage or bleeding reported. Previous drainage and packing were performed, but packing was removed prematurely, leading to incomplete drainage. Current symptoms include pain and difficulty with sitting and walking.  After discussing the location of the cyst with the patient as well as the indurated nature and concern for inability to provide appropriate drainage, patient and I are comfortable proceeding with antibiotics at this time.  Will start Bactrim p.o. twice daily x 7 days.  I recommend the patient follows up with her OB/GYN for further evaluation especially since this is a recurrent concern.  ED and return precautions reviewed and provided in AVS.  Follow-up as needed.     Discharge Instructions      You were seen today for concerns of a Bartholin's gland abscess on the right side of the vaginal opening.  At this time your cyst is in a very difficult location for drainage and I recommend following up with OB/GYN if you continue to have concerns.  I am sending in an antibiotic called Bactrim for you to take by mouth twice per day for 7 days.  This may help with resolving the cyst/abscess or make it coalesce into them more easily drained pocket.  I am also sending in Diflucan in case you develop an antibiotic induced yeast infection.  If you develop any of the following symptoms please go  to the emergency room as these could be signs of a more severe infection: More severe swelling and pain despite antibiotic use, fevers that are not responding to Tylenol  and ibuprofen , copious amounts of drainage or bleeding     ED Prescriptions     Medication Sig Dispense Auth. Provider   sulfamethoxazole-trimethoprim (BACTRIM DS) 800-160 MG tablet Take 1 tablet by mouth 2 (two) times daily for 7 days. 14 tablet Vannesa Abair E, PA-C   fluconazole (DIFLUCAN) 150 MG tablet Take 1 tablet (150 mg total) by mouth every three (3) days as needed. May repeat in 3 days if symptoms not resolved 2 tablet Tykia Mellone E, PA-C      PDMP not reviewed this encounter.   Marylene Rocky BRAVO, PA-C 03/27/24 1715

## 2024-03-27 NOTE — Discharge Instructions (Addendum)
 You were seen today for concerns of a Bartholin's gland abscess on the right side of the vaginal opening.  At this time your cyst is in a very difficult location for drainage and I recommend following up with OB/GYN if you continue to have concerns.  I am sending in an antibiotic called Bactrim for you to take by mouth twice per day for 7 days.  This may help with resolving the cyst/abscess or make it coalesce into them more easily drained pocket.  I am also sending in Diflucan in case you develop an antibiotic induced yeast infection.  If you develop any of the following symptoms please go to the emergency room as these could be signs of a more severe infection: More severe swelling and pain despite antibiotic use, fevers that are not responding to Tylenol  and ibuprofen , copious amounts of drainage or bleeding
